# Patient Record
Sex: Female | Born: 1970 | Race: Black or African American | Hispanic: No | Marital: Married | Smoking: Never smoker
Health system: Southern US, Community
[De-identification: ages and names within clinical notes are randomized; demographics above are authoritative.]

## PROBLEM LIST (undated history)

## (undated) HISTORY — PX: TUBAL LIGATION: SHX77

---

## 2000-06-11 ENCOUNTER — Ambulatory Visit (HOSPITAL_COMMUNITY): Admission: RE | Admit: 2000-06-11 | Discharge: 2000-06-11 | Payer: Self-pay | Admitting: Neurosurgery

## 2000-10-09 ENCOUNTER — Ambulatory Visit (HOSPITAL_COMMUNITY): Admission: RE | Admit: 2000-10-09 | Discharge: 2000-10-09 | Payer: Self-pay | Admitting: Neurosurgery

## 2001-01-19 ENCOUNTER — Encounter: Payer: Self-pay | Admitting: Family Medicine

## 2001-01-19 ENCOUNTER — Ambulatory Visit (HOSPITAL_COMMUNITY): Admission: RE | Admit: 2001-01-19 | Discharge: 2001-01-19 | Payer: Self-pay | Admitting: Family Medicine

## 2001-09-24 ENCOUNTER — Other Ambulatory Visit: Admission: RE | Admit: 2001-09-24 | Discharge: 2001-09-24 | Payer: Self-pay | Admitting: Family Medicine

## 2014-08-30 ENCOUNTER — Encounter (HOSPITAL_COMMUNITY): Payer: Self-pay | Admitting: *Deleted

## 2014-08-30 ENCOUNTER — Emergency Department (HOSPITAL_COMMUNITY)
Admission: EM | Admit: 2014-08-30 | Discharge: 2014-08-30 | Disposition: A | Discharge status: Home / Self Care | Payer: Self-pay | Attending: Emergency Medicine | Admitting: Emergency Medicine

## 2014-08-30 ENCOUNTER — Emergency Department (HOSPITAL_COMMUNITY): Payer: Medicare Other

## 2014-08-30 DIAGNOSIS — K529 Noninfective gastroenteritis and colitis, unspecified: Secondary | ICD-10-CM | POA: Insufficient documentation

## 2014-08-30 DIAGNOSIS — Z3202 Encounter for pregnancy test, result negative: Secondary | ICD-10-CM | POA: Insufficient documentation

## 2014-08-30 DIAGNOSIS — Z9851 Tubal ligation status: Secondary | ICD-10-CM | POA: Insufficient documentation

## 2014-08-30 DIAGNOSIS — R079 Chest pain, unspecified: Secondary | ICD-10-CM | POA: Insufficient documentation

## 2014-08-30 DIAGNOSIS — Z79899 Other long term (current) drug therapy: Secondary | ICD-10-CM | POA: Insufficient documentation

## 2014-08-30 LAB — CBC WITH DIFFERENTIAL/PLATELET
Basophils Absolute: 0 10*3/uL (ref 0.0–0.1)
Basophils Relative: 0 % (ref 0–1)
Eosinophils Absolute: 0 10*3/uL (ref 0.0–0.7)
Eosinophils Relative: 0 % (ref 0–5)
HCT: 30.8 % — ABNORMAL LOW (ref 36.0–46.0)
Hemoglobin: 9.6 g/dL — ABNORMAL LOW (ref 12.0–15.0)
Lymphocytes Relative: 6 % — ABNORMAL LOW (ref 12–46)
Lymphs Abs: 0.5 10*3/uL — ABNORMAL LOW (ref 0.7–4.0)
MCH: 20.3 pg — ABNORMAL LOW (ref 26.0–34.0)
MCHC: 31.2 g/dL (ref 30.0–36.0)
MCV: 65.1 fL — ABNORMAL LOW (ref 78.0–100.0)
Monocytes Absolute: 0.5 10*3/uL (ref 0.1–1.0)
Monocytes Relative: 6 % (ref 3–12)
Neutro Abs: 7.7 10*3/uL (ref 1.7–7.7)
Neutrophils Relative %: 88 % — ABNORMAL HIGH (ref 43–77)
Platelets: 410 10*3/uL — ABNORMAL HIGH (ref 150–400)
RBC: 4.73 MIL/uL (ref 3.87–5.11)
RDW: 16.9 % — ABNORMAL HIGH (ref 11.5–15.5)
WBC: 8.7 10*3/uL (ref 4.0–10.5)

## 2014-08-30 LAB — PREGNANCY, URINE: Preg Test, Ur: NEGATIVE

## 2014-08-30 LAB — COMPREHENSIVE METABOLIC PANEL
ALT: 18 U/L (ref 0–35)
AST: 25 U/L (ref 0–37)
Albumin: 4.2 g/dL (ref 3.5–5.2)
Alkaline Phosphatase: 44 U/L (ref 39–117)
Anion gap: 6 (ref 5–15)
BUN: 13 mg/dL (ref 6–23)
CO2: 22 mmol/L (ref 19–32)
Calcium: 8.8 mg/dL (ref 8.4–10.5)
Chloride: 106 mmol/L (ref 96–112)
Creatinine, Ser: 0.75 mg/dL (ref 0.50–1.10)
GFR calc Af Amer: >90 mL/min (ref >90)
GFR calc non Af Amer: >90 mL/min (ref >90)
Glucose, Bld: 104 mg/dL — ABNORMAL HIGH (ref 70–99)
Potassium: 2.9 mmol/L — ABNORMAL LOW (ref 3.5–5.1)
Sodium: 134 mmol/L — ABNORMAL LOW (ref 135–145)
Total Bilirubin: 0.8 mg/dL (ref 0.3–1.2)
Total Protein: 7.5 g/dL (ref 6.0–8.3)

## 2014-08-30 LAB — LIPASE, BLOOD: Lipase: 29 U/L (ref 11–59)

## 2014-08-30 LAB — URINALYSIS, ROUTINE W REFLEX MICROSCOPIC
BILIRUBIN URINE: NEGATIVE
Glucose, UA: NEGATIVE mg/dL
Leukocytes, UA: NEGATIVE
Nitrite: NEGATIVE
PH: 7 (ref 5.0–8.0)
Protein, ur: NEGATIVE mg/dL
SPECIFIC GRAVITY, URINE: 1.02 (ref 1.005–1.030)
UROBILINOGEN UA: 0.2 mg/dL (ref 0.0–1.0)

## 2014-08-30 LAB — URINE MICROSCOPIC-ADD ON

## 2014-08-30 MED ORDER — ONDANSETRON HCL 4 MG/2ML IJ SOLN
4.0000 mg | Freq: Once | INTRAMUSCULAR | Status: AC
  Administered 2014-08-30: 4 mg via INTRAMUSCULAR
  Filled 2014-08-30: qty 2

## 2014-08-30 MED ORDER — PROMETHAZINE HCL 25 MG PO TABS: 25.0000 mg | ORAL_TABLET | Freq: Four times a day (QID) | ORAL | Status: DC | PRN

## 2014-08-30 MED ORDER — PROMETHAZINE HCL 25 MG/ML IJ SOLN
12.5000 mg | Freq: Once | INTRAMUSCULAR | Status: AC
Start: 2014-08-30 — End: 2014-08-30
  Administered 2014-08-30: 12.5 mg via INTRAVENOUS
  Filled 2014-08-30: qty 1

## 2014-08-30 MED ORDER — ONDANSETRON 4 MG PO TBDP: 4.0000 mg | ORAL_TABLET | Freq: Three times a day (TID) | ORAL | Status: DC | PRN

## 2014-08-30 MED ORDER — HYDROMORPHONE HCL 1 MG/ML IJ SOLN
1.0000 mg | Freq: Once | INTRAMUSCULAR | Status: AC
  Administered 2014-08-30: 1 mg via INTRAVENOUS
  Filled 2014-08-30: qty 1

## 2014-08-30 MED ORDER — SODIUM CHLORIDE 0.9 % IV SOLN
Freq: Once | INTRAVENOUS | Status: AC
  Administered 2014-08-30: 15:00:00 via INTRAVENOUS

## 2014-08-30 NOTE — ED Notes (Signed)
N/V/D, onset today. Lower back,, throat hurts.

## 2014-08-30 NOTE — ED Notes (Signed)
Pt on bed on all fours. Refused to let nurse assist her with getting into gown. Stated she was to weak to turn over and her husband could help her. Pt also refused to have temp checked

## 2014-08-30 NOTE — ED Notes (Signed)
Pt c/o cough/cold sx since yesterday with n/v/d and chills today. nad noted.

## 2014-08-30 NOTE — Discharge Instructions (Signed)
Rest and take antinausea medicine as directed. Also recommend the liquids with sugar and then bland diet as tolerated. Return for any new or worse symptoms. Bed rest would be recommended. Follow-up with your doctor if not improving in 1-2 days.

## 2014-08-30 NOTE — ED Provider Notes (Signed)
CSN: [id number]     Arrival date & time 08/30/14  1342 History  This chart was scribed for Stephanie Kidd Annarose Ouellet, MD by Bronson CurbJacqueline Melvin, ED Scribe. This patient was seen in room APA16A/APA16A and the patient's care was started at 2:42 PM.   Chief Complaint  Patient presents with  . Emesis     Patient is a 44 y.o. female presenting with vomiting. The history is provided by the patient. No language interpreter was used.  Emesis Severity:  Moderate Timing:  Intermittent Quality:  Stomach contents Progression:  Unchanged Chronicity:  New Relieved by:  None tried Worsened by:  Nothing tried Ineffective treatments:  None tried Associated symptoms: chills, diarrhea and headaches   Associated symptoms: no abdominal pain and no sore throat      HPI Comments: Stephanie Kidd is a 44 y.o. female, with no significant medical history, who presents to the Emergency Department complaining of sudden onset, intermittent vomiting that began PTA after taking half a dose of codeine cough syrup. Patient states she is currently vomiting stomach acid with streaks of blood. There is associated chills, lower back pain, headache, and chest pain described as tightness. She also reports 3 episodes of diarrhea, but states this has resolved. Patient is also complaining of cold symptoms of and sinus pressure that began last night at approximately 2100. She has tried Sudafed 2 days ago, but is currently taking Robitussin for the cough (nonproductive). SShe denies hematemesis, hematochezia, fever, rhinorrhea, sore throat, blurred vision, SOB, edema, abdominal pain, dysuria, neck pain, or rash.    History reviewed. No pertinent past medical history. Past Surgical History  Procedure Laterality Date  . Tubal ligation     History reviewed. No pertinent family history. History  Substance Use Topics  . Smoking status: Never Smoker   . Smokeless tobacco: Not on file  . Alcohol Use: No   OB History    No data available      Review of Systems  Constitutional: Positive for chills. Negative for fever.  HENT: Positive for congestion. Negative for rhinorrhea and sore throat.   Eyes: Negative for visual disturbance.  Respiratory: Positive for cough. Negative for shortness of breath.   Cardiovascular: Positive for chest pain. Negative for leg swelling.  Gastrointestinal: Positive for nausea, vomiting and diarrhea. Negative for abdominal pain and blood in stool.  Genitourinary: Negative for dysuria.  Musculoskeletal: Positive for back pain. Negative for neck pain.  Skin: Negative for rash.  Neurological: Positive for headaches. Negative for dizziness and light-headedness.  Hematological: Does not bruise/bleed easily.  Psychiatric/Behavioral: Negative for confusion.      Allergies  Review of patient's allergies indicates no active allergies.  Home Medications   Prior to Admission medications   Medication Sig Start Date End Date Taking? Authorizing Provider  acetaminophen-codeine 120-12 MG/5ML solution Take 5 mLs by mouth every 4 (four) hours as needed for moderate pain.   Yes Historical Provider, MD  Multiple Vitamins-Minerals (ADULT ONE DAILY GUMMIES PO) Take by mouth daily.   Yes Historical Provider, MD  pseudoephedrine (SUDAFED) 120 MG 12 hr tablet Take 120 mg by mouth every 12 (twelve) hours as needed for congestion.   Yes Historical Provider, MD  ondansetron (ZOFRAN ODT) 4 MG disintegrating tablet Take 1 tablet (4 mg total) by mouth every 8 (eight) hours as needed. 08/30/14   Stephanie Kidd Mitsue Peery, MD  promethazine (PHENERGAN) 25 MG tablet Take 1 tablet (25 mg total) by mouth every 6 (six) hours as needed. 08/30/14   Stephanie Kidd Bayan Hedstrom, MD  Triage Vitals: BP 134/107 mmHg  Pulse 80  Temp(Src) 99.2 F (37.3 C) (Oral)  Resp 24  SpO2 100%  LMP 08/29/2014  Physical Exam  Constitutional: She is oriented to person, place, and time. She appears well-developed and well-nourished. No distress.  HENT:  Head:  Normocephalic and atraumatic.  Mouth/Throat: Mucous membranes are normal.  Eyes: Conjunctivae and EOM are normal. Pupils are equal, round, and reactive to light.  Neck: Neck supple. No tracheal deviation present.  Cardiovascular: Normal rate, regular rhythm and normal heart sounds.   Pulmonary/Chest: Effort normal and breath sounds normal. No respiratory distress.  Lungs clear bilaterally.  Abdominal: Soft. Bowel sounds are normal. There is no tenderness.  Musculoskeletal: Normal range of motion. She exhibits no edema.  No swelling in the ankles.  Neurological: She is alert and oriented to person, place, and time.  Skin: Skin is warm and dry.  Psychiatric: She has a normal mood and affect. Her behavior is normal.  Nursing note and vitals reviewed.   ED Course  Procedures (including critical care time)  DIAGNOSTIC STUDIES: Oxygen Saturation is 100% on room air, normal by my interpretation.    COORDINATION OF CARE: At 1449 Discussed treatment plan with patient. Patient agrees.   Labs Review Labs Reviewed  COMPREHENSIVE METABOLIC PANEL - Abnormal; Notable for the following:    Sodium 134 (*)    Potassium 2.9 (*)    Glucose, Bld 104 (*)    All other components within normal limits  CBC WITH DIFFERENTIAL/PLATELET - Abnormal; Notable for the following:    Hemoglobin 9.6 (*)    HCT 30.8 (*)    MCV 65.1 (*)    MCH 20.3 (*)    RDW 16.9 (*)    Platelets 410 (*)    Neutrophils Relative % 88 (*)    Lymphocytes Relative 6 (*)    Lymphs Abs 0.5 (*)    All other components within normal limits  URINALYSIS, ROUTINE W REFLEX MICROSCOPIC - Abnormal; Notable for the following:    Hgb urine dipstick LARGE (*)    Ketones, ur TRACE (*)    All other components within normal limits  URINE MICROSCOPIC-ADD ON - Abnormal; Notable for the following:    Squamous Epithelial / LPF FEW (*)    All other components within normal limits  LIPASE, BLOOD  PREGNANCY, URINE   Results for orders placed  or performed during the hospital encounter of 08/30/14  Comprehensive metabolic panel  Result Value Ref Range   Sodium 134 (L) 135 - 145 mmol/L   Potassium 2.9 (L) 3.5 - 5.1 mmol/L   Chloride 106 96 - 112 mmol/L   CO2 22 19 - 32 mmol/L   Glucose, Bld 104 (H) 70 - 99 mg/dL   BUN 13 6 - 23 mg/dL   Creatinine, Ser 1.61 0.50 - 1.10 mg/dL   Calcium 8.8 8.4 - 09.6 mg/dL   Total Protein 7.5 6.0 - 8.3 g/dL   Albumin 4.2 3.5 - 5.2 g/dL   AST 25 0 - 37 U/L   ALT 18 0 - 35 U/L   Alkaline Phosphatase 44 39 - 117 U/L   Total Bilirubin 0.8 0.3 - 1.2 mg/dL   GFR calc non Af Amer >90 >90 mL/min   GFR calc Af Amer >90 >90 mL/min   Anion gap 6 5 - 15  Lipase, blood  Result Value Ref Range   Lipase 29 11 - 59 U/L  CBC with Differential  Result Value Ref Range  WBC 8.7 4.0 - 10.5 K/uL   RBC 4.73 3.87 - 5.11 MIL/uL   Hemoglobin 9.6 (L) 12.0 - 15.0 g/dL   HCT 16.1 (L) 09.6 - 04.5 %   MCV 65.1 (L) 78.0 - 100.0 fL   MCH 20.3 (L) 26.0 - 34.0 pg   MCHC 31.2 30.0 - 36.0 g/dL   RDW 40.9 (H) 81.1 - 91.4 %   Platelets 410 (H) 150 - 400 K/uL   Neutrophils Relative % 88 (H) 43 - 77 %   Lymphocytes Relative 6 (L) 12 - 46 %   Monocytes Relative 6 3 - 12 %   Eosinophils Relative 0 0 - 5 %   Basophils Relative 0 0 - 1 %   Neutro Abs 7.7 1.7 - 7.7 K/uL   Lymphs Abs 0.5 (L) 0.7 - 4.0 K/uL   Monocytes Absolute 0.5 0.1 - 1.0 K/uL   Eosinophils Absolute 0.0 0.0 - 0.7 K/uL   Basophils Absolute 0.0 0.0 - 0.1 K/uL   RBC Morphology POLYCHROMASIA PRESENT    Smear Review PLATELET COUNT CONFIRMED BY SMEAR   Pregnancy, urine  Result Value Ref Range   Preg Test, Ur NEGATIVE NEGATIVE  Urinalysis, Routine w reflex microscopic  Result Value Ref Range   Color, Urine YELLOW YELLOW   APPearance CLEAR CLEAR   Specific Gravity, Urine 1.020 1.005 - 1.030   pH 7.0 5.0 - 8.0   Glucose, UA NEGATIVE NEGATIVE mg/dL   Hgb urine dipstick LARGE (A) NEGATIVE   Bilirubin Urine NEGATIVE NEGATIVE   Ketones, ur TRACE (A)  NEGATIVE mg/dL   Protein, ur NEGATIVE NEGATIVE mg/dL   Urobilinogen, UA 0.2 0.0 - 1.0 mg/dL   Nitrite NEGATIVE NEGATIVE   Leukocytes, UA NEGATIVE NEGATIVE  Urine microscopic-add on  Result Value Ref Range   Squamous Epithelial / LPF FEW (A) RARE   WBC, UA 0-2 <3 WBC/hpf   RBC / HPF 11-20 <3 RBC/hpf   Bacteria, UA RARE RARE     Imaging Review Dg Abd Acute W/chest  08/30/2014   CLINICAL DATA:  Vomiting. Low back pain. Headache. Chest pain. Cold symptoms.  EXAM: ACUTE ABDOMEN SERIES (ABDOMEN 2 VIEW & CHEST 1 VIEW)  COMPARISON:  None.  FINDINGS: Frontal view of the chest demonstrates midline trachea. Normal heart size and mediastinal contours. No pleural effusion or pneumothorax. Clear lungs.  Abdominal films demonstrate no free intraperitoneal air or significant air-fluid levels on upright positioning. No bowel distention on supine imaging. Mild S-shaped thoracolumbar spine curvature. 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  IMPRESSION: No acute findings.  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   Electronically Signed   By: Jeronimo Greaves M.D.   On: 08/30/2014 15:57     EKG Interpretation None      MDM   Final diagnoses:  Chest pain  Gastroenteritis    The patient last evening started with upper respiratory type symptoms of cough some drainage chills and then today started with nausea vomiting and diarrhea. Some chest discomfort throughout all of the anterior part of the chest but nothing severe. Patient improved here with fluids and antinausea medicine and pain medicine. Patient was taking cough medicine with codeine that could've caused the nausea and vomiting but should not cause the diarrhea.  Clinically not concerned about an acute cardiac event. The discomfort in the chest seems to be associated with the vomiting.  I personally performed the services described  in  this documentation, which was scribed in my presence. The recorded information has been reviewed and is accurate.     Stephanie Mulders, MD 08/30/14 [phone]

## 2017-01-16 ENCOUNTER — Ambulatory Visit (INDEPENDENT_AMBULATORY_CARE_PROVIDER_SITE_OTHER): Payer: Self-pay | Admitting: Orthopaedic Surgery

## 2017-12-23 ENCOUNTER — Other Ambulatory Visit (HOSPITAL_COMMUNITY): Payer: Self-pay | Admitting: *Deleted

## 2017-12-23 DIAGNOSIS — Z1231 Encounter for screening mammogram for malignant neoplasm of breast: Secondary | ICD-10-CM

## 2018-01-12 ENCOUNTER — Encounter (HOSPITAL_COMMUNITY): Payer: Self-pay

## 2018-01-12 ENCOUNTER — Ambulatory Visit (HOSPITAL_COMMUNITY)
Admission: RE | Admit: 2018-01-12 | Discharge: 2018-01-12 | Disposition: A | Discharge status: Home / Self Care | Payer: PRIVATE HEALTH INSURANCE | Source: Ambulatory Visit | Attending: *Deleted | Admitting: *Deleted

## 2018-01-12 DIAGNOSIS — Z1231 Encounter for screening mammogram for malignant neoplasm of breast: Secondary | ICD-10-CM | POA: Insufficient documentation

## 2018-03-19 ENCOUNTER — Ambulatory Visit (INDEPENDENT_AMBULATORY_CARE_PROVIDER_SITE_OTHER): Payer: Self-pay | Admitting: Orthopaedic Surgery

## 2018-12-08 ENCOUNTER — Other Ambulatory Visit (HOSPITAL_COMMUNITY): Payer: Self-pay | Admitting: *Deleted

## 2018-12-08 DIAGNOSIS — Z1231 Encounter for screening mammogram for malignant neoplasm of breast: Secondary | ICD-10-CM

## 2019-01-15 ENCOUNTER — Ambulatory Visit (HOSPITAL_COMMUNITY)
Admission: RE | Admit: 2019-01-15 | Discharge: 2019-01-15 | Disposition: A | Discharge status: Home / Self Care | Payer: PRIVATE HEALTH INSURANCE | Source: Ambulatory Visit | Attending: *Deleted | Admitting: *Deleted

## 2019-01-15 ENCOUNTER — Other Ambulatory Visit: Payer: Self-pay

## 2019-01-15 DIAGNOSIS — Z1231 Encounter for screening mammogram for malignant neoplasm of breast: Secondary | ICD-10-CM | POA: Diagnosis not present

## 2020-04-18 ENCOUNTER — Other Ambulatory Visit (HOSPITAL_COMMUNITY): Payer: Self-pay | Admitting: *Deleted

## 2020-04-18 DIAGNOSIS — Z1231 Encounter for screening mammogram for malignant neoplasm of breast: Secondary | ICD-10-CM

## 2020-05-10 ENCOUNTER — Other Ambulatory Visit: Payer: Self-pay

## 2020-05-10 ENCOUNTER — Ambulatory Visit (HOSPITAL_COMMUNITY)
Admission: RE | Admit: 2020-05-10 | Discharge: 2020-05-10 | Disposition: A | Discharge status: Home / Self Care | Payer: PRIVATE HEALTH INSURANCE | Source: Ambulatory Visit | Attending: *Deleted | Admitting: *Deleted

## 2020-05-10 DIAGNOSIS — Z1231 Encounter for screening mammogram for malignant neoplasm of breast: Secondary | ICD-10-CM | POA: Insufficient documentation

## 2020-05-12 ENCOUNTER — Other Ambulatory Visit (HOSPITAL_COMMUNITY): Payer: Self-pay | Admitting: *Deleted

## 2020-05-12 DIAGNOSIS — R928 Other abnormal and inconclusive findings on diagnostic imaging of breast: Secondary | ICD-10-CM

## 2020-06-06 ENCOUNTER — Ambulatory Visit (HOSPITAL_COMMUNITY)
Admission: RE | Admit: 2020-06-06 | Discharge: 2020-06-06 | Disposition: A | Discharge status: Home / Self Care | Payer: PRIVATE HEALTH INSURANCE | Source: Ambulatory Visit | Attending: *Deleted | Admitting: *Deleted

## 2020-06-06 ENCOUNTER — Other Ambulatory Visit: Payer: Self-pay

## 2020-06-06 DIAGNOSIS — R928 Other abnormal and inconclusive findings on diagnostic imaging of breast: Secondary | ICD-10-CM | POA: Diagnosis present

## 2020-06-13 ENCOUNTER — Encounter (HOSPITAL_COMMUNITY): Payer: PRIVATE HEALTH INSURANCE

## 2020-06-13 ENCOUNTER — Other Ambulatory Visit (HOSPITAL_COMMUNITY): Payer: PRIVATE HEALTH INSURANCE

## 2021-05-14 ENCOUNTER — Other Ambulatory Visit: Payer: Self-pay

## 2021-05-14 DIAGNOSIS — Z1231 Encounter for screening mammogram for malignant neoplasm of breast: Secondary | ICD-10-CM

## 2021-06-21 ENCOUNTER — Other Ambulatory Visit (HOSPITAL_COMMUNITY): Payer: Self-pay | Admitting: Radiology

## 2021-06-22 ENCOUNTER — Ambulatory Visit (HOSPITAL_COMMUNITY): Payer: Self-pay

## 2021-07-13 ENCOUNTER — Inpatient Hospital Stay (HOSPITAL_COMMUNITY): Admission: RE | Admit: 2021-07-13 | Payer: Self-pay | Source: Ambulatory Visit

## 2021-07-13 ENCOUNTER — Ambulatory Visit (HOSPITAL_COMMUNITY): Payer: Self-pay

## 2021-07-27 ENCOUNTER — Inpatient Hospital Stay (HOSPITAL_COMMUNITY): Payer: Self-pay | Attending: Hematology | Admitting: *Deleted

## 2021-07-27 ENCOUNTER — Ambulatory Visit (HOSPITAL_COMMUNITY)
Admission: RE | Admit: 2021-07-27 | Discharge: 2021-07-27 | Disposition: A | Discharge status: Home / Self Care | Payer: Self-pay | Source: Ambulatory Visit | Attending: Obstetrics and Gynecology | Admitting: Obstetrics and Gynecology

## 2021-07-27 ENCOUNTER — Encounter (HOSPITAL_COMMUNITY): Payer: Self-pay

## 2021-07-27 ENCOUNTER — Other Ambulatory Visit: Payer: Self-pay

## 2021-07-27 VITALS — BP 116/85 | Wt 181.9 lb

## 2021-07-27 DIAGNOSIS — Z01419 Encounter for gynecological examination (general) (routine) without abnormal findings: Secondary | ICD-10-CM

## 2021-07-27 DIAGNOSIS — Z1231 Encounter for screening mammogram for malignant neoplasm of breast: Secondary | ICD-10-CM | POA: Insufficient documentation

## 2021-07-27 NOTE — Progress Notes (Signed)
[name] is a 51 y.o. No obstetric history on file. female who presents to [facility] clinic today with no complaints.    Pap Smear: Pap smear completed today. Last Pap smear was over 3 years ago at the [facility] clinic and was normal per patient. Per patient has no history of an abnormal Pap smear. Last Pap smear result is not available in Epic.   Physical exam: Breasts Breasts symmetrical. No skin abnormalities bilateral breasts. No nipple retraction bilateral breasts. No nipple discharge bilateral breasts. No lymphadenopathy. No lumps palpated bilateral breasts. No complaints of pain or tenderness on exam.     MS DIGITAL SCREENING TOMO BILATERAL  Result Date: 05/12/2020 CLINICAL DATA:  Screening. EXAM: DIGITAL SCREENING BILATERAL MAMMOGRAM WITH TOMO AND CAD COMPARISON:  Previous exam(s). ACR Breast Density Category b: There are scattered areas of fibroglandular density. FINDINGS: In the right breast, a possible asymmetry warrants further evaluation. In the left breast, no findings suspicious for malignancy. Images were processed with CAD. IMPRESSION: Further evaluation is suggested for possible asymmetry in the right breast. RECOMMENDATION: Diagnostic mammogram and possibly ultrasound of the right breast. (Code:FI-R-76M) The patient will be contacted regarding the findings, and additional imaging will be scheduled. BI-RADS CATEGORY  0: Incomplete. Need additional imaging evaluation and/or prior mammograms for comparison. Electronically Signed   By: Ted Mcalpine M.D.   On: 05/12/2020 09:37   MS DIGITAL SCREENING TOMO BILATERAL  Result Date: 01/15/2019 CLINICAL DATA:  Screening. EXAM: DIGITAL SCREENING BILATERAL MAMMOGRAM WITH TOMO AND CAD COMPARISON:  Previous exam(s). ACR Breast Density Category c: The breast tissue is heterogeneously dense, which may obscure small masses. FINDINGS: There are no findings suspicious for malignancy. Images were processed with CAD.  IMPRESSION: No mammographic evidence of malignancy. A result letter of this screening mammogram will be mailed directly to the patient. RECOMMENDATION: Screening mammogram in one year. (Code:SM-B-01Y) BI-RADS CATEGORY  1: Negative. Electronically Signed   By: Frederico Hamman M.D.   On: 01/15/2019 10:08   MS DIGITAL SCREENING TOMO BILATERAL  Result Date: 01/12/2018 CLINICAL DATA:  Screening. EXAM: DIGITAL SCREENING BILATERAL MAMMOGRAM WITH TOMO AND CAD COMPARISON:  Previous exam(s). ACR Breast Density Category c: The breast tissue is heterogeneously dense, which may obscure small masses. FINDINGS: There are no findings suspicious for malignancy. Images were processed with CAD. IMPRESSION: No mammographic evidence of malignancy. A result letter of this screening mammogram will be mailed directly to the patient. RECOMMENDATION: Screening mammogram in one year. (Code:[id number]) BI-RADS CATEGORY  1: Negative. Electronically Signed   By: Ted Mcalpine M.D.   On: 01/12/2018 17:24   MS DIGITAL DIAG TOMO UNI RIGHT  Result Date: 06/06/2020 CLINICAL DATA:  Possible asymmetry in the outer right breast on a recent screening mammogram. EXAM: DIGITAL DIAGNOSTIC UNILATERAL RIGHT MAMMOGRAM WITH TOMO AND CAD COMPARISON:  Previous exam(s). ACR Breast Density Category b: There are scattered areas of fibroglandular density. FINDINGS: 3D tomographic and 2D generated true lateral and spot compression craniocaudal images of the right breast demonstrate normal appearing fibroglandular tissue at the location of the recently suspected asymmetry, unchanged compared previous examinations. Mammographic images were processed with CAD. IMPRESSION: No evidence of malignancy. The recently suspected right breast asymmetry was close apposition of normal breast tissue. RECOMMENDATION: Bilateral screening mammogram in 1 year. I have discussed the findings and recommendations with the patient. If applicable, a reminder letter will be sent  to the patient regarding the next appointment. BI-RADS CATEGORY  1: Negative. Electronically Signed   By:  Beckie Salts M.D.   On: 06/06/2020 08:41     Pelvic/Bimanual Ext Genitalia No lesions, no swelling and no discharge observed on external genitalia.        Vagina Vagina pink and normal texture. No lesions and scant amount of white frothy discharge observed in vagina. Patient complained of vaginal discharge. Told patient if the Pap smear shows an infection will send a prescription to her pharmacy. Advised patient to follow-up with the [facility] if the vaginal discharge continues.        Cervix Cervix is present. Cervix pink and of normal texture. No discharge observed.    Uterus Uterus is present and palpable. Uterus in normal position and normal size.        Adnexae Bilateral ovaries present and palpable. No tenderness on palpation.         Rectovaginal No rectal exam completed today since patient had no rectal complaints. No skin abnormalities observed on exam.     Smoking History: Patient has never smoked.   Patient Navigation: Patient education provided. Access to services provided for patient through [facility] program.   Colorectal Cancer Screening: Per patient has never had colonoscopy completed. Patient stated she completed a FIT in October or November 2022 that was negative that was given to her by the [facility]. No complaints today.    Breast and Cervical Cancer Risk Assessment: Patient has family history of her mother having breast cancer. Patient has no known genetic mutations or history of radiation treatment to the chest before age 35. Patient does not have history of cervical dysplasia, immunocompromised, or DES exposure in-utero.  Risk Assessment     Risk Scores       07/27/2021   Last edited by: Narda Rutherford, LPN   5-year risk: 1.9 %   Lifetime risk: 13.3 %            A: [facility] exam with pap  smear No complaints.  P: Referred patient to [facility] for a screening mammogram. Appointment scheduled Friday, July 27, 2021 at 1300.  Priscille Heidelberg, RN 07/27/2021 12:06 PM

## 2021-07-27 NOTE — Patient Instructions (Addendum)
Explained breast self awareness with [name]. Pap smear completed today. Let her know [facility] will cover Pap smears and HPV typing every 5 years unless has a history of abnormal Pap smears. Referred patient to [facility] for a screening mammogram. Appointment scheduled Friday, July 27, 2021 at 1300. Patient escorted to mammography following [facility] appointment for her screening mammogram. Let patient know will follow up with her within the next couple weeks with results of Pap smear by phone. Informed patient that [name] will follow up with her within the next couple of weeks with results of her mammogram by letter or phone. Stephanie Kidd verbalized understanding.  Viren Lebeau, Kathaleen Maser, RN 12:06 PM

## 2021-07-30 ENCOUNTER — Other Ambulatory Visit: Payer: Self-pay

## 2021-07-30 DIAGNOSIS — Z01419 Encounter for gynecological examination (general) (routine) without abnormal findings: Secondary | ICD-10-CM

## 2021-07-31 LAB — CYTOLOGY - PAP
Comment: NEGATIVE
Diagnosis: NEGATIVE
High risk HPV: NEGATIVE

## 2021-08-01 ENCOUNTER — Telehealth: Payer: Self-pay

## 2021-08-01 NOTE — Telephone Encounter (Signed)
Patient informed negative Pap/HPV results, no yeast infection or bacterial vaginitis was seen on pap smear, next pap due in 5 years. Patient verbalized understanding.

## 2022-12-26 ENCOUNTER — Other Ambulatory Visit: Payer: Self-pay | Admitting: *Deleted

## 2022-12-26 DIAGNOSIS — Z1231 Encounter for screening mammogram for malignant neoplasm of breast: Secondary | ICD-10-CM

## 2023-01-06 ENCOUNTER — Ambulatory Visit (HOSPITAL_COMMUNITY)
Admission: RE | Admit: 2023-01-06 | Discharge: 2023-01-06 | Disposition: A | Discharge status: Home / Self Care | Payer: Self-pay | Source: Ambulatory Visit | Attending: *Deleted | Admitting: *Deleted

## 2023-01-06 ENCOUNTER — Encounter (HOSPITAL_COMMUNITY): Payer: Self-pay

## 2023-01-06 DIAGNOSIS — Z1231 Encounter for screening mammogram for malignant neoplasm of breast: Secondary | ICD-10-CM | POA: Insufficient documentation

## 2023-01-21 ENCOUNTER — Encounter (INDEPENDENT_AMBULATORY_CARE_PROVIDER_SITE_OTHER): Payer: Self-pay | Admitting: Gastroenterology

## 2023-01-21 ENCOUNTER — Ambulatory Visit (INDEPENDENT_AMBULATORY_CARE_PROVIDER_SITE_OTHER): Payer: Self-pay | Admitting: Gastroenterology

## 2023-01-21 VITALS — BP 138/85 | HR 71 | Temp 97.3°F | Ht <= 58 in | Wt 188.2 lb

## 2023-01-21 DIAGNOSIS — D500 Iron deficiency anemia secondary to blood loss (chronic): Secondary | ICD-10-CM

## 2023-01-21 DIAGNOSIS — Z1211 Encounter for screening for malignant neoplasm of colon: Secondary | ICD-10-CM | POA: Insufficient documentation

## 2023-01-21 DIAGNOSIS — D509 Iron deficiency anemia, unspecified: Secondary | ICD-10-CM

## 2023-01-21 MED ORDER — PEG 3350-KCL-NA BICARB-NACL 420 G PO SOLR: 4000.0000 mL | Freq: Once | ORAL | 0 refills | Status: AC

## 2023-01-21 NOTE — Progress Notes (Signed)
Vista Lawman , M.D. Gastroenterology & Hepatology [facility] [address] [place], [place] [address] Primary Care Physician: Tylene Fantasia., PA-C [address] [place] [address]  Chief Complaint:  Microcytic anemia and Colon cancer screening    History of Present Illness: Stephanie Kidd is a 52 y.o. female with no significant medical issues except obesity ( BMI : 40) who presents for evaluation of  Microcytic anemia and Colon cancer screening .  Patient reports that at one time she was told that she had iron deficiency, and she was taking iron tablets.  During that time she had hair loss and this was managed by gynecologist.  She had now stopped taking iron tablets.  Patient is fairly healthy and has no GI complaints. The patient denies having any nausea, vomiting, fever, chills, hematochezia, melena, hematemesis, abdominal distention, abdominal pain, diarrhea, jaundice, pruritus or weight loss.  Last FAO:ZHYQ  Last Colonoscopy:None  FHx: neg for any gastrointestinal/liver disease, Mother breast cancer Social: neg smoking, alcohol or illicit drug use Surgical: -C-Section, TL  Past Medical History:History reviewed. No pertinent past medical history.  Past Surgical History: Past Surgical History:  Procedure Laterality Date   CESAREAN SECTION  1989   TUBAL LIGATION      Family History: Family History  Problem Relation Age of Onset   Breast cancer Mother 11   Hypertension Maternal Grandmother     Social History: Social History   Tobacco Use  Smoking Status Never  Smokeless Tobacco Not on file   Social History   Substance and Sexual Activity  Alcohol Use No   Social History   Substance and Sexual Activity  Drug Use No    Allergies: No Known Allergies  Medications: Current Outpatient Medications  Medication Sig Dispense Refill   acetaminophen-codeine 120-12 MG/5ML solution Take 5 mLs by mouth  every 4 (four) hours as needed for moderate pain.     cetirizine (ZYRTEC) 10 MG tablet Take 10 mg by mouth daily.     Multiple Vitamins-Minerals (ADULT ONE DAILY GUMMIES PO) Take by mouth daily.     pseudoephedrine (SUDAFED) 120 MG 12 hr tablet Take 120 mg by mouth every 12 (twelve) hours as needed for congestion.     No current facility-administered medications for this visit.    Review of Systems: GENERAL: negative for malaise, night sweats HEENT: No changes in hearing or vision, no nose bleeds or other nasal problems. NECK: Negative for lumps, goiter, pain and significant neck swelling RESPIRATORY: Negative for cough, wheezing CARDIOVASCULAR: Negative for chest pain, leg swelling, palpitations, orthopnea GI: SEE HPI MUSCULOSKELETAL: Negative for joint pain or swelling, back pain, and muscle pain. SKIN: Negative for lesions, rash HEMATOLOGY Negative for prolonged bleeding, bruising easily, and swollen nodes. ENDOCRINE: Negative for cold or heat intolerance, polyuria, polydipsia and goiter. NEURO: negative for tremor, gait imbalance, syncope and seizures. The remainder of the review of systems is noncontributory.   Physical Exam: BP 138/85 (BP Location: Right Arm, Patient Position: Sitting, Cuff Size: Large)   Pulse 71   Temp (!) 97.3 F (36.3 C) (Temporal)   Ht 4\' 9"  (1.448 m)   Wt 188 lb 3.2 oz (85.4 kg)   LMP 11/12/2017   BMI 40.73 kg/m  GENERAL: The patient is AO x3, in no acute distress. HEENT: Head is normocephalic and atraumatic. EOMI are intact. Mouth is well hydrated and without lesions. NECK: Supple. No masses LUNGS: Clear to auscultation. No presence of rhonchi/wheezing/rales. Adequate  chest expansion HEART: RRR, normal s1 and s2. ABDOMEN: Soft, nontender, no guarding, no peritoneal signs, and nondistended. BS +. No masses.  EXTREMITIES: Without any cyanosis, clubbing, rash, lesions or edema. NEUROLOGIC: AOx3, no focal motor deficit. SKIN: no jaundice, no  rashes   Imaging/Labs: as above  I personally reviewed and interpreted the available labs, imaging and endoscopic files.  MCV: 70 HBG : 10.7   Impression and Plan:  Stephanie Kidd is a 52 y.o. female with no significant medical issues except obesity ( BMI : 40) who presents for evaluation of  Microcytic anemia and Colon cancer screening .  #Microcytic anemia  Microcytic anemia can be due to iron deficiency or thalassemia.  Patient was told at one time she has iron deficiency and was on iron supplementation.  She has chronic microcytic anemia dating back to 2016 with current lab work demonstrating hemoglobin 10.7 MCV 70.  We do not have any recent iron panel.  Patient had stopped taking iron supplementation. We will get a repeat CBC with iron panel.  Given history of presumed iron deficiency we will pursue bidirectional endoscopy as per ACG guidelines  #CRC screening   Patient is average risk for colorectal cancer.  Does not have constipation.  Will pursue with colonoscopy  BMI: 40  Lab work with normal liver enzymes, no imaging on file       - walking at a brisk pace/biking at moderate intesity 2.5-5 hours per week     - use pedometer/step counter to track activity     - goal to lose 5-10% of initial body weight     - avoid suagry drinks and juices, use zero calorie beverages     - increase water intake     - eat a low carb diet with plenty of veggies and fruit     - Get sufficient sleep 7-8 hrs nightly     - maitain active lifestyle     - avoid alcohol     - recommend 2-3 cups Coffee daily     - Counsel on lowering cholesterol by having a diet rich in vegetables,          protein (avoid red meats) and good fats(fish, salmon).   All questions were answered.      Vista Lawman, MD Gastroenterology and Hepatology [facility]

## 2023-01-21 NOTE — H&P (View-Only) (Signed)
Vista Lawman , M.D. Gastroenterology & Hepatology [facility] [address] [place], [place] [address] Primary Care Physician: Tylene Fantasia., PA-C [address] [place] [address]  Chief Complaint:  Microcytic anemia and Colon cancer screening    History of Present Illness: Stephanie Kidd is a 52 y.o. female with no significant medical issues except obesity ( BMI : 40) who presents for evaluation of  Microcytic anemia and Colon cancer screening .  Patient reports that at one time she was told that she had iron deficiency, and she was taking iron tablets.  During that time she had hair loss and this was managed by gynecologist.  She had now stopped taking iron tablets.  Patient is fairly healthy and has no GI complaints. The patient denies having any nausea, vomiting, fever, chills, hematochezia, melena, hematemesis, abdominal distention, abdominal pain, diarrhea, jaundice, pruritus or weight loss.  Last FAO:ZHYQ  Last Colonoscopy:None  FHx: neg for any gastrointestinal/liver disease, Mother breast cancer Social: neg smoking, alcohol or illicit drug use Surgical: -C-Section, TL  Past Medical History:History reviewed. No pertinent past medical history.  Past Surgical History: Past Surgical History:  Procedure Laterality Date   CESAREAN SECTION  1989   TUBAL LIGATION      Family History: Family History  Problem Relation Age of Onset   Breast cancer Mother 11   Hypertension Maternal Grandmother     Social History: Social History   Tobacco Use  Smoking Status Never  Smokeless Tobacco Not on file   Social History   Substance and Sexual Activity  Alcohol Use No   Social History   Substance and Sexual Activity  Drug Use No    Allergies: No Known Allergies  Medications: Current Outpatient Medications  Medication Sig Dispense Refill   acetaminophen-codeine 120-12 MG/5ML solution Take 5 mLs by mouth  every 4 (four) hours as needed for moderate pain.     cetirizine (ZYRTEC) 10 MG tablet Take 10 mg by mouth daily.     Multiple Vitamins-Minerals (ADULT ONE DAILY GUMMIES PO) Take by mouth daily.     pseudoephedrine (SUDAFED) 120 MG 12 hr tablet Take 120 mg by mouth every 12 (twelve) hours as needed for congestion.     No current facility-administered medications for this visit.    Review of Systems: GENERAL: negative for malaise, night sweats HEENT: No changes in hearing or vision, no nose bleeds or other nasal problems. NECK: Negative for lumps, goiter, pain and significant neck swelling RESPIRATORY: Negative for cough, wheezing CARDIOVASCULAR: Negative for chest pain, leg swelling, palpitations, orthopnea GI: SEE HPI MUSCULOSKELETAL: Negative for joint pain or swelling, back pain, and muscle pain. SKIN: Negative for lesions, rash HEMATOLOGY Negative for prolonged bleeding, bruising easily, and swollen nodes. ENDOCRINE: Negative for cold or heat intolerance, polyuria, polydipsia and goiter. NEURO: negative for tremor, gait imbalance, syncope and seizures. The remainder of the review of systems is noncontributory.   Physical Exam: BP 138/85 (BP Location: Right Arm, Patient Position: Sitting, Cuff Size: Large)   Pulse 71   Temp (!) 97.3 F (36.3 C) (Temporal)   Ht 4\' 9"  (1.448 m)   Wt 188 lb 3.2 oz (85.4 kg)   LMP 11/12/2017   BMI 40.73 kg/m  GENERAL: The patient is AO x3, in no acute distress. HEENT: Head is normocephalic and atraumatic. EOMI are intact. Mouth is well hydrated and without lesions. NECK: Supple. No masses LUNGS: Clear to auscultation. No presence of rhonchi/wheezing/rales. Adequate  chest expansion HEART: RRR, normal s1 and s2. ABDOMEN: Soft, nontender, no guarding, no peritoneal signs, and nondistended. BS +. No masses.  EXTREMITIES: Without any cyanosis, clubbing, rash, lesions or edema. NEUROLOGIC: AOx3, no focal motor deficit. SKIN: no jaundice, no  rashes   Imaging/Labs: as above  I personally reviewed and interpreted the available labs, imaging and endoscopic files.  MCV: 70 HBG : 10.7   Impression and Plan:  Stephanie Kidd is a 52 y.o. female with no significant medical issues except obesity ( BMI : 40) who presents for evaluation of  Microcytic anemia and Colon cancer screening .  #Microcytic anemia  Microcytic anemia can be due to iron deficiency or thalassemia.  Patient was told at one time she has iron deficiency and was on iron supplementation.  She has chronic microcytic anemia dating back to 2016 with current lab work demonstrating hemoglobin 10.7 MCV 70.  We do not have any recent iron panel.  Patient had stopped taking iron supplementation. We will get a repeat CBC with iron panel.  Given history of presumed iron deficiency we will pursue bidirectional endoscopy as per ACG guidelines  #CRC screening   Patient is average risk for colorectal cancer.  Does not have constipation.  Will pursue with colonoscopy  BMI: 40  Lab work with normal liver enzymes, no imaging on file       - walking at a brisk pace/biking at moderate intesity 2.5-5 hours per week     - use pedometer/step counter to track activity     - goal to lose 5-10% of initial body weight     - avoid suagry drinks and juices, use zero calorie beverages     - increase water intake     - eat a low carb diet with plenty of veggies and fruit     - Get sufficient sleep 7-8 hrs nightly     - maitain active lifestyle     - avoid alcohol     - recommend 2-3 cups Coffee daily     - Counsel on lowering cholesterol by having a diet rich in vegetables,          protein (avoid red meats) and good fats(fish, salmon).   All questions were answered.      Vista Lawman, MD Gastroenterology and Hepatology [facility]

## 2023-01-21 NOTE — Patient Instructions (Signed)
It was very nice to meet you today, as dicussed with will plan for the following :  1) Will schedule for Upper endoscopy and Colonoscopy given anemia

## 2023-01-22 ENCOUNTER — Telehealth: Payer: Self-pay

## 2023-01-22 LAB — CBC
Hematocrit: 36.4 % (ref 34.0–46.6)
Hemoglobin: 11.3 g/dL (ref 11.1–15.9)
MCH: 22.4 pg — ABNORMAL LOW (ref 26.6–33.0)
MCHC: 31 g/dL — ABNORMAL LOW (ref 31.5–35.7)
MCV: 72 fL — ABNORMAL LOW (ref 79–97)
Platelets: 332 10*3/uL (ref 150–450)
RBC: 5.04 x10E6/uL (ref 3.77–5.28)
RDW: 16.5 % — ABNORMAL HIGH (ref 11.7–15.4)
WBC: 6.1 10*3/uL (ref 3.4–10.8)

## 2023-01-22 LAB — IRON,TIBC AND FERRITIN PANEL
Ferritin: 60 ng/mL (ref 15–150)
Iron Saturation: 24 % (ref 15–55)
Iron: 77 ug/dL (ref 27–159)
Total Iron Binding Capacity: 324 ug/dL (ref 250–450)
UIBC: 247 ug/dL (ref 131–425)

## 2023-01-22 NOTE — Telephone Encounter (Signed)
Left message requesting return call, regarding radiology bill. Left message on voicemail requesting a return call.

## 2023-01-23 ENCOUNTER — Encounter (INDEPENDENT_AMBULATORY_CARE_PROVIDER_SITE_OTHER): Payer: Self-pay

## 2023-01-27 NOTE — Patient Instructions (Signed)
Stephanie Kidd  01/27/2023     @PREFPERIOPPHARMACY @   Your procedure is scheduled on  01/30/2023.   Report to [name] at  1130  A.M.   Call this number if you have problems the morning of surgery:  [phone]  If you experience any cold or flu symptoms such as cough, fever, chills, shortness of breath, etc. between now and your scheduled surgery, please notify us at the above number.   Remember:  Follow the diet and prep instructions given to you by the office.     Take these medicines the morning of surgery with A SIP OF WATER                                             zyrtec.     Do not wear jewelry, make-up or nail polish, including gel polish,  artificial nails, or any other type of covering on natural nails (fingers and  toes).  Do not wear lotions, powders, or perfumes, or deodorant.  Do not shave 48 hours prior to surgery.  Men may shave face and neck.  Do not bring valuables to the hospital.  [facility] is not responsible for any belongings or valuables.  Contacts, dentures or bridgework may not be worn into surgery.  Leave your suitcase in the car.  After surgery it may be brought to your room.  For patients admitted to the hospital, discharge time will be determined by your treatment team.  Patients discharged the day of surgery will not be allowed to drive home and must have someone with you for 24 hours.    Special instructions:   DO NOT smoke tobacco or vape for 24 hours before your procedure.  Please read over the following fact sheets that you were given. Anesthesia Post-op Instructions and Care and Recovery After Surgery      Upper Endoscopy, Adult, Care After After the procedure, it is common to have a sore throat. It is also common to have: Mild stomach pain or discomfort. Bloating. Nausea. Follow these instructions at home: The instructions below may help you care for yourself at home. Your health care provider may give you more  instructions. If you have questions, ask your health care provider. If you were given a sedative during the procedure, it can affect you for several hours. Do not drive or operate machinery until your health care provider says that it is safe. If you will be going home right after the procedure, plan to have a responsible adult: Take you home from the hospital or clinic. You will not be allowed to drive. Care for you for the time you are told. Follow instructions from your health care provider about what you may eat and drink. Return to your normal activities as told by your health care provider. Ask your health care provider what activities are safe for you. Take over-the-counter and prescription medicines only as told by your health care provider. Contact a health care provider if you: Have a sore throat that lasts longer than one day. Have trouble swallowing. Have a fever. Get help right away if you: Vomit blood or your vomit looks like coffee grounds. Have bloody, black, or tarry stools. Have a very bad sore throat or you cannot swallow. Have difficulty breathing or very bad pain in your chest or abdomen. These  symptoms may be an emergency. Get help right away. Call 911. Do not wait to see if the symptoms will go away. Do not drive yourself to the hospital. Summary After the procedure, it is common to have a sore throat, mild stomach discomfort, bloating, and nausea. If you were given a sedative during the procedure, it can affect you for several hours. Do not drive until your health care provider says that it is safe. Follow instructions from your health care provider about what you may eat and drink. Return to your normal activities as told by your health care provider. This information is not intended to replace advice given to you by your health care provider. Make sure you discuss any questions you have with your health care provider. Document Revised: 10/03/2021 Document Reviewed:  10/03/2021 Elsevier Patient Education  2024 Elsevier [employer]. Colonoscopy, Adult, Care After The following information offers guidance on how to care for yourself after your procedure. Your health care provider may also give you more specific instructions. If you have problems or questions, contact your health care provider. What can I expect after the procedure? After the procedure, it is common to have: A small amount of blood in your stool for 24 hours after the procedure. Some gas. Mild cramping or bloating of your abdomen. Follow these instructions at home: Eating and drinking  Drink enough fluid to keep your urine pale yellow. Follow instructions from your health care provider about eating or drinking restrictions. Resume your normal diet as told by your health care provider. Avoid heavy or fried foods that are hard to digest. Activity Rest as told by your health care provider. Avoid sitting for a long time without moving. Get up to take short walks every 1-2 hours. This is important to improve blood flow and breathing. Ask for help if you feel weak or unsteady. Return to your normal activities as told by your health care provider. Ask your health care provider what activities are safe for you. Managing cramping and bloating  Try walking around when you have cramps or feel bloated. If directed, apply heat to your abdomen as told by your health care provider. Use the heat source that your health care provider recommends, such as a moist heat pack or a heating pad. Place a towel between your skin and the heat source. Leave the heat on for 20-30 minutes. Remove the heat if your skin turns bright red. This is especially important if you are unable to feel pain, heat, or cold. You have a greater risk of getting burned. General instructions If you were given a sedative during the procedure, it can affect you for several hours. Do not drive or operate machinery until your health care provider  says that it is safe. For the first 24 hours after the procedure: Do not sign important documents. Do not drink alcohol. Do your regular daily activities at a slower pace than normal. Eat soft foods that are easy to digest. Take over-the-counter and prescription medicines only as told by your health care provider. Keep all follow-up visits. This is important. Contact a health care provider if: You have blood in your stool 2-3 days after the procedure. Get help right away if: You have more than a small spotting of blood in your stool. You have large blood clots in your stool. You have swelling of your abdomen. You have nausea or vomiting. You have a fever. You have increasing pain in your abdomen that is not relieved with medicine. These  symptoms may be an emergency. Get help right away. Call 911. Do not wait to see if the symptoms will go away. Do not drive yourself to the hospital. Summary After the procedure, it is common to have a small amount of blood in your stool. You may also have mild cramping and bloating of your abdomen. If you were given a sedative during the procedure, it can affect you for several hours. Do not drive or operate machinery until your health care provider says that it is safe. Get help right away if you have a lot of blood in your stool, nausea or vomiting, a fever, or increased pain in your abdomen. This information is not intended to replace advice given to you by your health care provider. Make sure you discuss any questions you have with your health care provider. Document Revised: 08/06/2022 Document Reviewed: 02/14/2021 Elsevier Patient Education  2024 Elsevier Inc. Monitored Anesthesia Care, Care After The following information offers guidance on how to care for yourself after your procedure. Your health care provider may also give you more specific instructions. If you have problems or questions, contact your health care provider. What can I expect  after the procedure? After the procedure, it is common to have: Tiredness. Little or no memory about what happened during or after the procedure. Impaired judgment when it comes to making decisions. Nausea or vomiting. Some trouble with balance. Follow these instructions at home: For the time period you were told by your health care provider:  Rest. Do not participate in activities where you could fall or become injured. Do not drive or use machinery. Do not drink alcohol. Do not take sleeping pills or medicines that cause drowsiness. Do not make important decisions or sign legal documents. Do not take care of children on your own. Medicines Take over-the-counter and prescription medicines only as told by your health care provider. If you were prescribed antibiotics, take them as told by your health care provider. Do not stop using the antibiotic even if you start to feel better. Eating and drinking Follow instructions from your health care provider about what you may eat and drink. Drink enough fluid to keep your urine pale yellow. If you vomit: Drink clear fluids slowly and in small amounts as you are able. Clear fluids include water, ice chips, low-calorie sports drinks, and fruit juice that has water added to it (diluted fruit juice). Eat light and bland foods in small amounts as you are able. These foods include bananas, applesauce, rice, lean meats, toast, and crackers. General instructions  Have a responsible adult stay with you for the time you are told. It is important to have someone help care for you until you are awake and alert. If you have sleep apnea, surgery and some medicines can increase your risk for breathing problems. Follow instructions from your health care provider about wearing your sleep device: When you are sleeping. This includes during daytime naps. While taking prescription pain medicines, sleeping medicines, or medicines that make you drowsy. Do not use  any products that contain nicotine or tobacco. These products include cigarettes, chewing tobacco, and vaping devices, such as e-cigarettes. If you need help quitting, ask your health care provider. Contact a health care provider if: You feel nauseous or vomit every time you eat or drink. You feel light-headed. You are still sleepy or having trouble with balance after 24 hours. You get a rash. You have a fever. You have redness or swelling around the IV site.  Get help right away if: You have trouble breathing. You have new confusion after you get home. These symptoms may be an emergency. Get help right away. Call 911. Do not wait to see if the symptoms will go away. Do not drive yourself to the hospital. This information is not intended to replace advice given to you by your health care provider. Make sure you discuss any questions you have with your health care provider. Document Revised: 11/19/2021 Document Reviewed: 11/19/2021 Elsevier Patient Education  2024 [employer].

## 2023-01-28 ENCOUNTER — Other Ambulatory Visit: Payer: Self-pay

## 2023-01-28 ENCOUNTER — Encounter (HOSPITAL_COMMUNITY)
Admission: RE | Admit: 2023-01-28 | Discharge: 2023-01-28 | Disposition: A | Discharge status: Home / Self Care | Payer: Self-pay | Source: Ambulatory Visit | Attending: Gastroenterology | Admitting: Gastroenterology

## 2023-01-28 ENCOUNTER — Encounter (HOSPITAL_COMMUNITY): Payer: Self-pay

## 2023-01-28 DIAGNOSIS — R9431 Abnormal electrocardiogram [ECG] [EKG]: Secondary | ICD-10-CM | POA: Insufficient documentation

## 2023-01-28 DIAGNOSIS — Z01810 Encounter for preprocedural cardiovascular examination: Secondary | ICD-10-CM | POA: Insufficient documentation

## 2023-01-28 DIAGNOSIS — Z6841 Body Mass Index (BMI) 40.0 and over, adult: Secondary | ICD-10-CM | POA: Insufficient documentation

## 2023-01-29 ENCOUNTER — Telehealth (INDEPENDENT_AMBULATORY_CARE_PROVIDER_SITE_OTHER): Payer: Self-pay | Admitting: *Deleted

## 2023-01-29 NOTE — Telephone Encounter (Signed)
Pt called and states she has a slight headache today.having egd tomorrow and wanted to know what she could take for headache. I let her know she could take tylenol and not to take anything with aspirin in it. She verbalized understanding.

## 2023-01-30 ENCOUNTER — Ambulatory Visit (HOSPITAL_BASED_OUTPATIENT_CLINIC_OR_DEPARTMENT_OTHER): Payer: Self-pay | Admitting: Anesthesiology

## 2023-01-30 ENCOUNTER — Ambulatory Visit (HOSPITAL_COMMUNITY)
Admission: RE | Admit: 2023-01-30 | Discharge: 2023-01-30 | Disposition: A | Discharge status: Home / Self Care | Payer: Self-pay | Attending: Gastroenterology | Admitting: Gastroenterology

## 2023-01-30 ENCOUNTER — Encounter (HOSPITAL_COMMUNITY)
Admission: RE | Disposition: A | Discharge status: Home / Self Care | Payer: Self-pay | Source: Home / Self Care | Attending: Gastroenterology

## 2023-01-30 ENCOUNTER — Ambulatory Visit (HOSPITAL_COMMUNITY): Payer: Self-pay | Admitting: Anesthesiology

## 2023-01-30 ENCOUNTER — Encounter (HOSPITAL_COMMUNITY): Payer: Self-pay

## 2023-01-30 DIAGNOSIS — K635 Polyp of colon: Secondary | ICD-10-CM | POA: Insufficient documentation

## 2023-01-30 DIAGNOSIS — Z1211 Encounter for screening for malignant neoplasm of colon: Secondary | ICD-10-CM

## 2023-01-30 DIAGNOSIS — B9681 Helicobacter pylori [H. pylori] as the cause of diseases classified elsewhere: Secondary | ICD-10-CM | POA: Insufficient documentation

## 2023-01-30 DIAGNOSIS — K3189 Other diseases of stomach and duodenum: Secondary | ICD-10-CM

## 2023-01-30 DIAGNOSIS — K2950 Unspecified chronic gastritis without bleeding: Secondary | ICD-10-CM | POA: Insufficient documentation

## 2023-01-30 DIAGNOSIS — D509 Iron deficiency anemia, unspecified: Secondary | ICD-10-CM | POA: Insufficient documentation

## 2023-01-30 DIAGNOSIS — Z6841 Body Mass Index (BMI) 40.0 and over, adult: Secondary | ICD-10-CM | POA: Insufficient documentation

## 2023-01-30 DIAGNOSIS — K644 Residual hemorrhoidal skin tags: Secondary | ICD-10-CM | POA: Insufficient documentation

## 2023-01-30 DIAGNOSIS — E669 Obesity, unspecified: Secondary | ICD-10-CM | POA: Insufficient documentation

## 2023-01-30 DIAGNOSIS — K2970 Gastritis, unspecified, without bleeding: Secondary | ICD-10-CM

## 2023-01-30 DIAGNOSIS — K648 Other hemorrhoids: Secondary | ICD-10-CM | POA: Insufficient documentation

## 2023-01-30 DIAGNOSIS — D125 Benign neoplasm of sigmoid colon: Secondary | ICD-10-CM

## 2023-01-30 HISTORY — PX: BIOPSY: SHX5522

## 2023-01-30 HISTORY — PX: POLYPECTOMY: SHX5525

## 2023-01-30 HISTORY — PX: ESOPHAGOGASTRODUODENOSCOPY (EGD) WITH PROPOFOL: SHX5813

## 2023-01-30 HISTORY — PX: COLONOSCOPY WITH PROPOFOL: SHX5780

## 2023-01-30 SURGERY — ESOPHAGOGASTRODUODENOSCOPY (EGD) WITH PROPOFOL
Anesthesia: General

## 2023-01-30 MED ORDER — PROPOFOL 500 MG/50ML IV EMUL
INTRAVENOUS | Status: DC | PRN
  Administered 2023-01-30: 120 ug/kg/min via INTRAVENOUS

## 2023-01-30 MED ORDER — PROPOFOL 10 MG/ML IV BOLUS
INTRAVENOUS | Status: DC | PRN
  Administered 2023-01-30: 30 mg via INTRAVENOUS
  Administered 2023-01-30: 20 mg via INTRAVENOUS
  Administered 2023-01-30: 140 mg via INTRAVENOUS
  Administered 2023-01-30: 50 mg via INTRAVENOUS
  Administered 2023-01-30: 30 mg via INTRAVENOUS
  Administered 2023-01-30 (×2): 20 mg via INTRAVENOUS

## 2023-01-30 MED ORDER — LIDOCAINE HCL 1 % IJ SOLN
INTRAMUSCULAR | Status: DC | PRN
  Administered 2023-01-30: 50 mg via INTRADERMAL

## 2023-01-30 MED ORDER — STERILE WATER FOR IRRIGATION IR SOLN
Status: DC | PRN
  Administered 2023-01-30: 60 mL

## 2023-01-30 MED ORDER — LACTATED RINGERS IV SOLN: INTRAVENOUS | Status: DC

## 2023-01-30 MED ORDER — LACTATED RINGERS IV SOLN: INTRAVENOUS | Status: DC | PRN

## 2023-01-30 NOTE — Transfer of Care (Signed)
Immediate Anesthesia Transfer of Care Note  Patient: Youth worker  Procedure(s) Performed: ESOPHAGOGASTRODUODENOSCOPY (EGD) WITH PROPOFOL COLONOSCOPY WITH PROPOFOL BIOPSY POLYPECTOMY  Patient Location: Short Stay  Anesthesia Type:General  Level of Consciousness: awake  Airway & Oxygen Therapy: Patient Spontanous Breathing  Post-op Assessment: Report given to RN  Post vital signs: Reviewed and stable  Last Vitals:  Vitals Value Taken Time  BP    Temp    Pulse    Resp    SpO2      Last Pain:  Vitals:   01/30/23 1458  TempSrc: Oral  PainSc: 0-No pain         Complications: No notable events documented.

## 2023-01-30 NOTE — Anesthesia Postprocedure Evaluation (Signed)
Anesthesia Post Note  Patient: Youth worker  Procedure(s) Performed: ESOPHAGOGASTRODUODENOSCOPY (EGD) WITH PROPOFOL COLONOSCOPY WITH PROPOFOL BIOPSY POLYPECTOMY  Patient location during evaluation: Short Stay Anesthesia Type: General Level of consciousness: awake and alert Pain management: pain level controlled Vital Signs Assessment: post-procedure vital signs reviewed and stable Respiratory status: spontaneous breathing Cardiovascular status: blood pressure returned to baseline and stable Postop Assessment: no apparent nausea or vomiting Anesthetic complications: no   No notable events documented.   Last Vitals:  Vitals:   01/30/23 1221 01/30/23 1458  BP: 121/85 122/81  Pulse: 69 64  Resp: 16 18  Temp: 36.7 C 36.4 C  SpO2: 100% 100%    Last Pain:  Vitals:   01/30/23 1458  TempSrc: Oral  PainSc: 0-No pain                 Amberlynn Tempesta

## 2023-01-30 NOTE — Op Note (Signed)
[facility] Patient Name: Stephanie Kidd Procedure Date: 01/30/2023 2:11 PM MRN: [id number] Date of Birth: 1970-10-03 Attending MD: Sanjuan Dame , MD, [id number] CSN: [id number] Age: 52 Admit Type: Outpatient Procedure:                Colonoscopy Indications:              Screening for colorectal malignant neoplasm Providers:                Sanjuan Dame, MD, Angelica Ran, Elinor Parkinson,                            Dyann Ruddle Referring MD:              Medicines:                Monitored Anesthesia Care Complications:            No immediate complications. Estimated Blood Loss:     Estimated blood loss: none. Procedure:                Pre-Anesthesia Assessment:                           - Prior to the procedure, a History and Physical                            was performed, and patient medications and                            allergies were reviewed. The patient's tolerance of                            previous anesthesia was also reviewed. The risks                            and benefits of the procedure and the sedation                            options and risks were discussed with the patient.                            All questions were answered, and informed consent                            was obtained. Prior Anticoagulants: The patient has                            taken no anticoagulant or antiplatelet agents. ASA                            Grade Assessment: III - A patient with severe                            systemic disease. After reviewing the risks and  benefits, the patient was deemed in satisfactory                            condition to undergo the procedure.                           After obtaining informed consent, the colonoscope                            was passed under direct vision. Throughout the                            procedure, the patient's blood pressure, pulse, and                            oxygen  saturations were monitored continuously. The                            [id number]) scope was introduced through the                            anus and advanced to the the cecum, identified by                            appendiceal orifice and ileocecal valve. The                            colonoscopy was performed without difficulty. The                            patient tolerated the procedure well. The quality                            of the bowel preparation was evaluated using the                            BBPS [facility] Bowel Preparation Scale) with scores                            of: Right Colon = 2 (minor amount of residual                            staining, small fragments of stool and/or opaque                            liquid, but mucosa seen well), Transverse Colon = 2                            (minor amount of residual staining, small fragments                            of stool and/or opaque liquid, but mucosa seen  well) and Left Colon = 2 (minor amount of residual                            staining, small fragments of stool and/or opaque                            liquid, but mucosa seen well). The total BBPS score                            equals 6. The ileocecal valve, appendiceal orifice,                            and rectum were photographed. Scope In: 2:41:53 PM Scope Out: 2:56:09 PM Scope Withdrawal Time: 0 hours 9 minutes 9 seconds  Total Procedure Duration: 0 hours 14 minutes 16 seconds  Findings:      A 3 mm polyp was found in the sigmoid colon. The polyp was sessile. The       polyp was removed with a cold biopsy forceps. Resection and retrieval       were complete.      Non-bleeding external internal hemorrhoids were found during       retroflexion. Impression:               - One 3 mm polyp in the sigmoid colon, removed with                            a cold biopsy forceps. Resected and retrieved.                            - Non-bleeding external internal hemorrhoids. Moderate Sedation:      Per Anesthesia Care Recommendation:           - Repeat colonoscopy for surveillance based on                            pathology results.                           - Return to GI office as previously scheduled. Procedure Code(s):        --- Professional ---                           [phone], Colonoscopy, flexible; with biopsy, single                            or multiple Diagnosis Code(s):        --- Professional ---                           Z12.11, Encounter for screening for malignant                            neoplasm of colon                           D12.5, Benign neoplasm of sigmoid colon  K64.4, Residual hemorrhoidal skin tags                           K64.8, Other hemorrhoids CPT copyright 2022 American Medical Association. All rights reserved. The codes documented in this report are preliminary and upon coder review may  be revised to meet current compliance requirements. Sanjuan Dame, MD Sanjuan Dame, MD 01/30/2023 3:06:13 PM This report has been signed electronically. Number of Addenda: 0

## 2023-01-30 NOTE — Op Note (Signed)
[facility] Patient Name: Stephanie Kidd Procedure Date: 01/30/2023 2:17 PM MRN: [id number] Date of Birth: 11/27/1970 Attending MD: Sanjuan Dame , MD, [id number] CSN: [id number] Age: 52 Admit Type: Outpatient Procedure:                Upper GI endoscopy Indications:              Iron deficiency anemia Providers:                Sanjuan Dame, MD, Angelica Ran, Elinor Parkinson,                            Dyann Ruddle Referring MD:              Medicines:                Monitored Anesthesia Care Complications:            No immediate complications. Estimated Blood Loss:     Estimated blood loss was minimal. Procedure:                Pre-Anesthesia Assessment:                           - Prior to the procedure, a History and Physical                            was performed, and patient medications and                            allergies were reviewed. The patient's tolerance of                            previous anesthesia was also reviewed. The risks                            and benefits of the procedure and the sedation                            options and risks were discussed with the patient.                            All questions were answered, and informed consent                            was obtained. Prior Anticoagulants: The patient has                            taken no anticoagulant or antiplatelet agents. ASA                            Grade Assessment: III - A patient with severe                            systemic disease. After reviewing the risks and  benefits, the patient was deemed in satisfactory                            condition to undergo the procedure.                           After obtaining informed consent, the endoscope was                            passed under direct vision. Throughout the                            procedure, the patient's blood pressure, pulse, and                            oxygen saturations  were monitored continuously. The                            [id number]-H190 ([id number]) scope was introduced through the                            mouth, and advanced to the second part of duodenum.                            The upper GI endoscopy was accomplished without                            difficulty. The patient tolerated the procedure                            well. Scope In: 2:29:25 PM Scope Out: 2:38:04 PM Total Procedure Duration: 0 hours 8 minutes 39 seconds  Findings:      Esophagogastric landmarks were identified: the Z-line was found at 40 cm       and the gastroesophageal junction was found at 40 cm from the incisors.      Erythematous mucosa without bleeding was found in the stomach. Biopsies       were taken with a cold forceps for histology.      A single 7 mm papule (nodule) with no stigmata of recent bleeding was       found in the gastric antrum. Biopsies were taken with a cold forceps for       histology.      The duodenal bulb and second portion of the duodenum were normal. Impression:               - Esophagogastric landmarks identified.                           - Erythematous mucosa in the stomach. Biopsied.                           - A single papule (nodule) found in the stomach.                            Biopsied.                           -  Normal duodenal bulb and second portion of the                            duodenum. Moderate Sedation:      Per Anesthesia Care Recommendation:           - Patient has a contact number available for                            emergencies. The signs and symptoms of potential                            delayed complications were discussed with the                            patient. Return to normal activities tomorrow.                            Written discharge instructions were provided to the                            patient.                           - Resume previous diet.                           - Continue  present medications.                           - Await pathology results.                           - Depending on path results may consider EUS for                            antral nodule Procedure Code(s):        --- Professional ---                           [id number], Esophagogastroduodenoscopy, flexible,                            transoral; with biopsy, single or multiple Diagnosis Code(s):        --- Professional ---                           K31.89, Other diseases of stomach and duodenum                           D50.9, Iron deficiency anemia, unspecified CPT copyright 2022 American Medical Association. All rights reserved. The codes documented in this report are preliminary and upon coder review may  be revised to meet current compliance requirements. Sanjuan Dame, MD Sanjuan Dame, MD 01/30/2023 3:03:45 PM This report has been signed electronically. Number of Addenda: 0

## 2023-01-30 NOTE — Discharge Instructions (Addendum)

## 2023-01-30 NOTE — Interval H&P Note (Signed)
History and Physical Interval Note:  01/30/2023 1:08 PM  Stephanie Kidd  has presented today for surgery, with the diagnosis of microcytic anemia.  The various methods of treatment have been discussed with the patient and family. After consideration of risks, benefits and other options for treatment, the patient has consented to  Procedure(s) with comments: ESOPHAGOGASTRODUODENOSCOPY (EGD) WITH PROPOFOL (N/A) - 1:30pm;asa 3 COLONOSCOPY WITH PROPOFOL (N/A) - 1:30pm;asa 3 as a surgical intervention.  The patient's history has been reviewed, patient examined, no change in status, stable for surgery.  I have reviewed the patient's chart and labs.  Questions were answered to the patient's satisfaction.     Stephanie Kidd [name]

## 2023-01-30 NOTE — Anesthesia Preprocedure Evaluation (Signed)
Anesthesia Evaluation  Patient identified by MRN, date of birth, ID band Patient awake    Reviewed: Allergy & Precautions, H&P , NPO status , Patient's Chart, lab work & pertinent test results, reviewed documented beta blocker date and time   Airway Mallampati: II  TM Distance: >3 FB Neck ROM: full    Dental no notable dental hx.    Pulmonary neg pulmonary ROS   Pulmonary exam normal breath sounds clear to auscultation       Cardiovascular Exercise Tolerance: Good negative cardio ROS  Rhythm:regular Rate:Normal     Neuro/Psych negative neurological ROS  negative psych ROS   GI/Hepatic negative GI ROS, Neg liver ROS,,,  Endo/Other  negative endocrine ROS    Renal/GU negative Renal ROS  negative genitourinary   Musculoskeletal   Abdominal   Peds  Hematology negative hematology ROS (+) Blood dyscrasia, anemia   Anesthesia Other Findings   Reproductive/Obstetrics negative OB ROS                             Anesthesia Physical Anesthesia Plan  ASA: 2  Anesthesia Plan: General   Post-op Pain Management:    Induction:   PONV Risk Score and Plan: Propofol infusion  Airway Management Planned:   Additional Equipment:   Intra-op Plan:   Post-operative Plan:   Informed Consent: I have reviewed the patients History and Physical, chart, labs and discussed the procedure including the risks, benefits and alternatives for the proposed anesthesia with the patient or authorized representative who has indicated his/her understanding and acceptance.     Dental Advisory Given  Plan Discussed with: CRNA  Anesthesia Plan Comments:        Anesthesia Quick Evaluation

## 2023-02-04 ENCOUNTER — Encounter (HOSPITAL_COMMUNITY): Payer: Self-pay | Admitting: Gastroenterology

## 2023-02-05 ENCOUNTER — Other Ambulatory Visit (HOSPITAL_COMMUNITY): Payer: Self-pay | Admitting: Gastroenterology

## 2023-02-05 MED ORDER — PANTOPRAZOLE SODIUM 40 MG PO TBEC: 40.0000 mg | DELAYED_RELEASE_TABLET | Freq: Two times a day (BID) | ORAL | 0 refills | Status: DC

## 2023-02-05 MED ORDER — AMOXICILLIN 500 MG PO TABS: 1000.0000 mg | ORAL_TABLET | Freq: Two times a day (BID) | ORAL | 0 refills | Status: AC

## 2023-02-05 MED ORDER — CLARITHROMYCIN 500 MG PO TABS: 500.0000 mg | ORAL_TABLET | Freq: Two times a day (BID) | ORAL | 0 refills | Status: AC

## 2023-02-05 NOTE — Progress Notes (Signed)
I reviewed the pathology results. Ann, can you send her a letter with the findings as described below please? Repeat colonoscopy in 10 years  Thanks,  Vista Lawman, MD Gastroenterology and Hepatology [facility]  ---------------------------------------------------------------------------------------------  [facility] [address], [address], [place], [place] [address] Phone:  [phone]   02/05/23 [place], [place]   Dear Stephanie Kidd,  I am writing to inform you that the biopsies taken during your recent endoscopic examination showed:  Hyperplastic Polyp  I am writing to let you know the results of your recent colonoscopy.  You had a total of 1 polyp removed. The pathology came back as "hyperplastic polyp." These findings are NOT cancer and do not turn into cancer. Given these findings, it is recommended that your next colonoscopy be performed in 10 years.   Also the results of your recent endoscopy (EGD).  You were found to have an infection called H. pylori, which is a bacteria that lives in the stomach. We will send you a pack of medications to take for 14 days: 2 antibiotics and an acid blocking medication pantoprazole. Take them all twice a day. It is VERY IMPORTANT that you take all of the medications as directed. If the infection is not fully treated, in can increase your risk of stomach cancer.   Please call us at [phone] if you have persistent problems or have questions about your condition that have not been fully answered at this time.  Sincerely,  Vista Lawman, MD Gastroenterology and Hepatology

## 2023-02-06 ENCOUNTER — Other Ambulatory Visit (INDEPENDENT_AMBULATORY_CARE_PROVIDER_SITE_OTHER): Payer: Self-pay | Admitting: *Deleted

## 2023-02-06 ENCOUNTER — Telehealth (INDEPENDENT_AMBULATORY_CARE_PROVIDER_SITE_OTHER): Payer: Self-pay | Admitting: *Deleted

## 2023-02-06 ENCOUNTER — Encounter (INDEPENDENT_AMBULATORY_CARE_PROVIDER_SITE_OTHER): Payer: Self-pay | Admitting: *Deleted

## 2023-02-06 NOTE — Telephone Encounter (Signed)
Discussed with patient per Dr. Tasia Catchings - I would not take Diflucan with these antibiotics as there is drug-drug interactions. Also for husband to be tested and treated it depends if he is having any symptoms and can see a gastroenterologist to assess if te sting is needed   Patient verbalized understanding.

## 2023-02-06 NOTE — Telephone Encounter (Signed)
Pt called and wanted to discuss results of biopsy. I read her the result letter and let her know she would also be receiving it in the mail soon as it was mailed yesterday.    She has picked up her 3 meds for h pylori and understands the results. She is requesting rx for diflucan to be called in. States everytime she takes antibiotics she gets a yeast infection. I asked her to just let us know if she starts to have symptoms and she wanted me to ask for diflucan because she knows she will get one and would like to have it now.  ( She also wanted to know if her husband needed to be tested for h pylori and I let her know not unless he is having symptoms and she states she was not having symptoms either)

## 2023-02-11 ENCOUNTER — Encounter (INDEPENDENT_AMBULATORY_CARE_PROVIDER_SITE_OTHER): Payer: Self-pay | Admitting: Gastroenterology

## 2023-02-11 NOTE — Progress Notes (Signed)
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 Biopsies were negative  After review of the endoscopic pictures with Dr Meridee Score ; it is reasonable to either repeat EGD in an year to ensure stability of the nodule or refer the patient for EUS at that time

## 2023-02-18 ENCOUNTER — Telehealth: Payer: Self-pay

## 2023-02-18 NOTE — Telephone Encounter (Signed)
Attempted follow up and wellness call to [facility] client from [facility].  No answer left voicemail requesting return call.  PCP [facility] Next appt 04/08/23.   Francee Nodal RN [employer]

## 2023-08-13 IMAGING — MG MM DIGITAL SCREENING BILAT W/ TOMO AND CAD
6 of 12 series · 6 of 36 positions shown · non-contrast
Comparison: Previous exam(s).

CLINICAL DATA: Screening.

EXAM:
DIGITAL SCREENING BILATERAL MAMMOGRAM WITH TOMOSYNTHESIS AND CAD
TECHNIQUE: Bilateral screening digital craniocaudal and mediolateral oblique
mammograms were obtained. Bilateral screening digital breast
tomosynthesis was performed. The images were evaluated with
computer-aided detection.

[R CC synth-2D (1 of 2)]
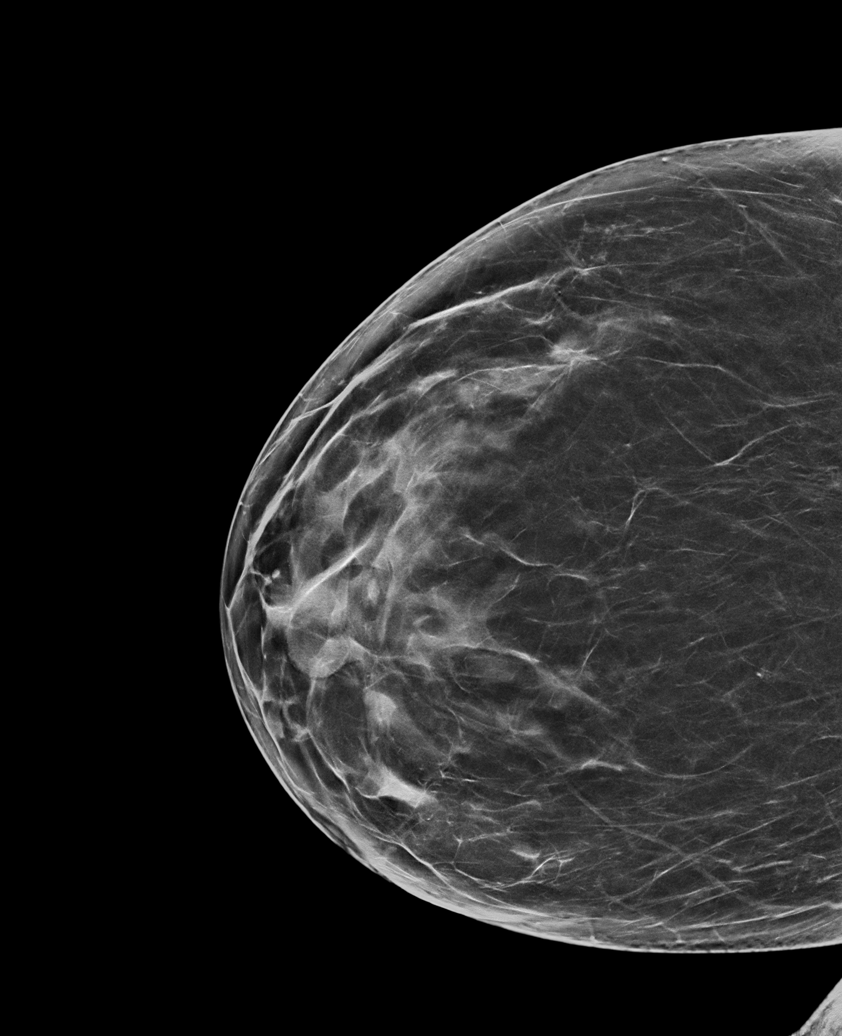

[L CC synth-2D (1 of 2)]
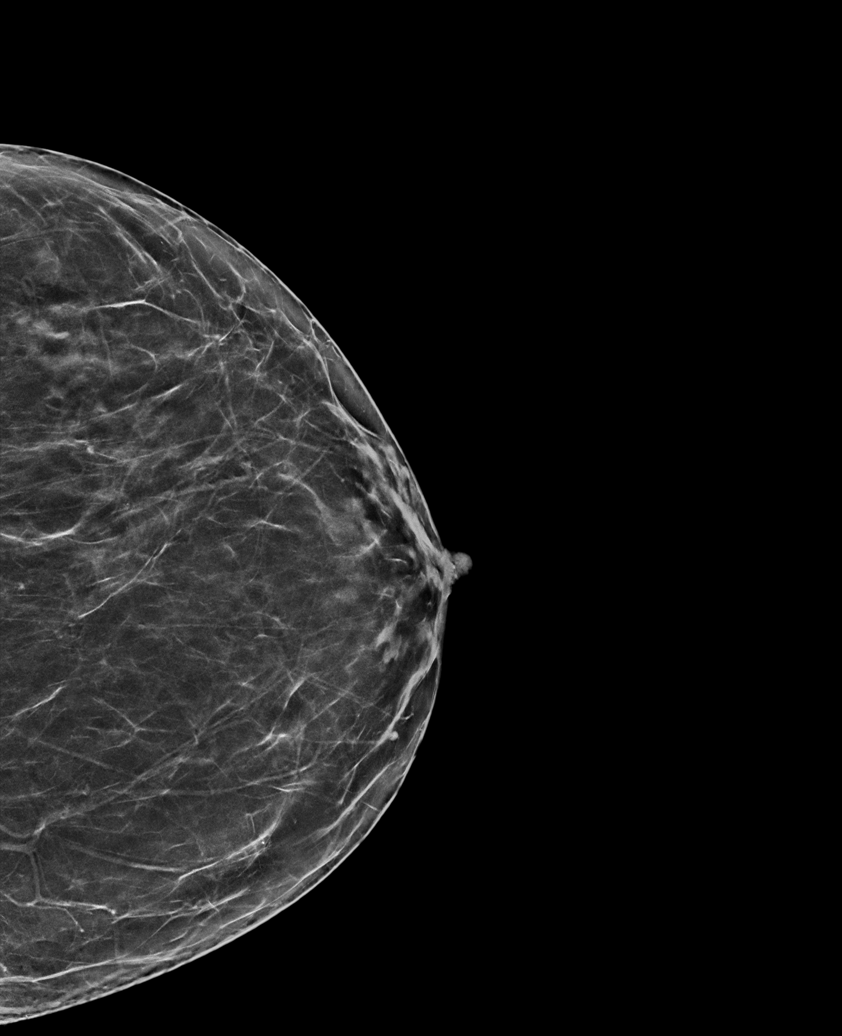

[L CC synth-2D (2 of 2)]
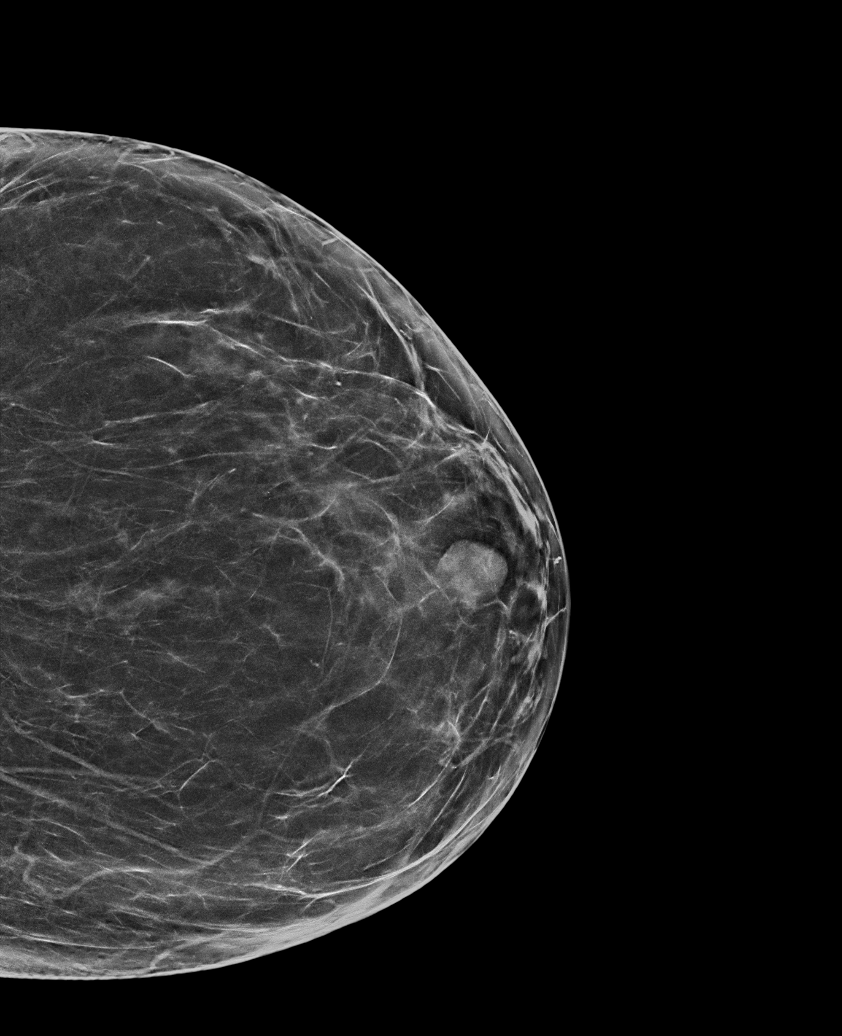

[R MLO synth-2D]
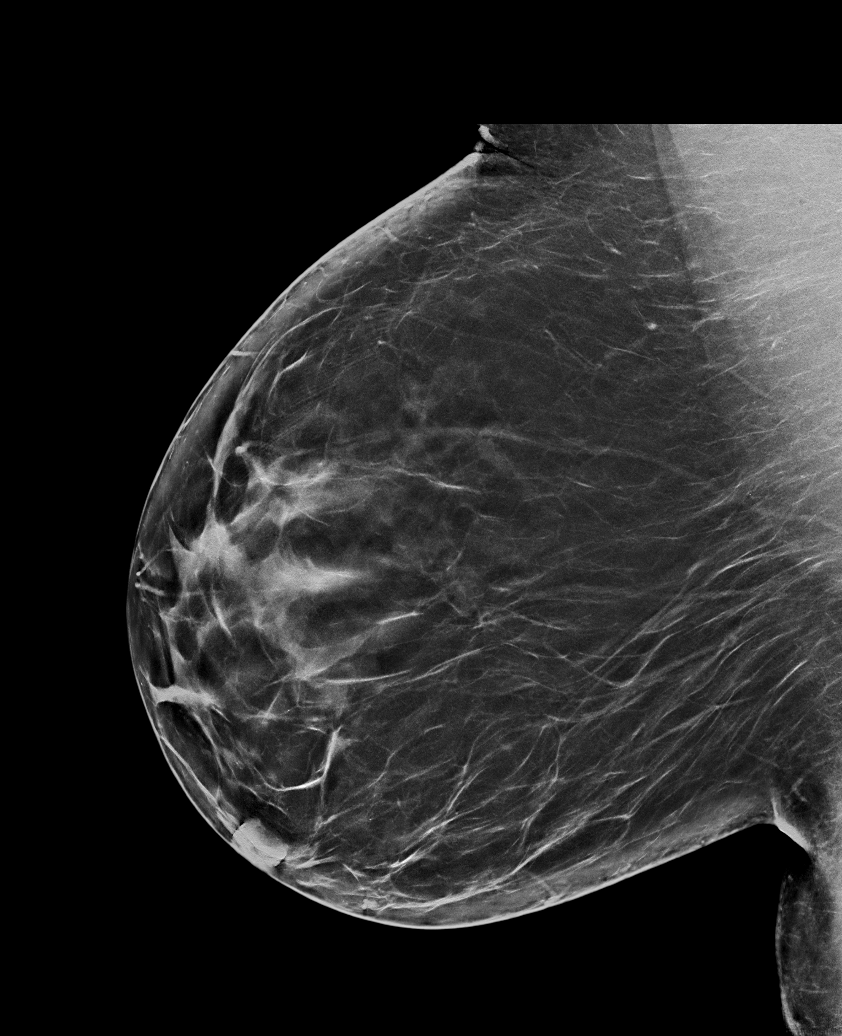

[L MLO synth-2D]
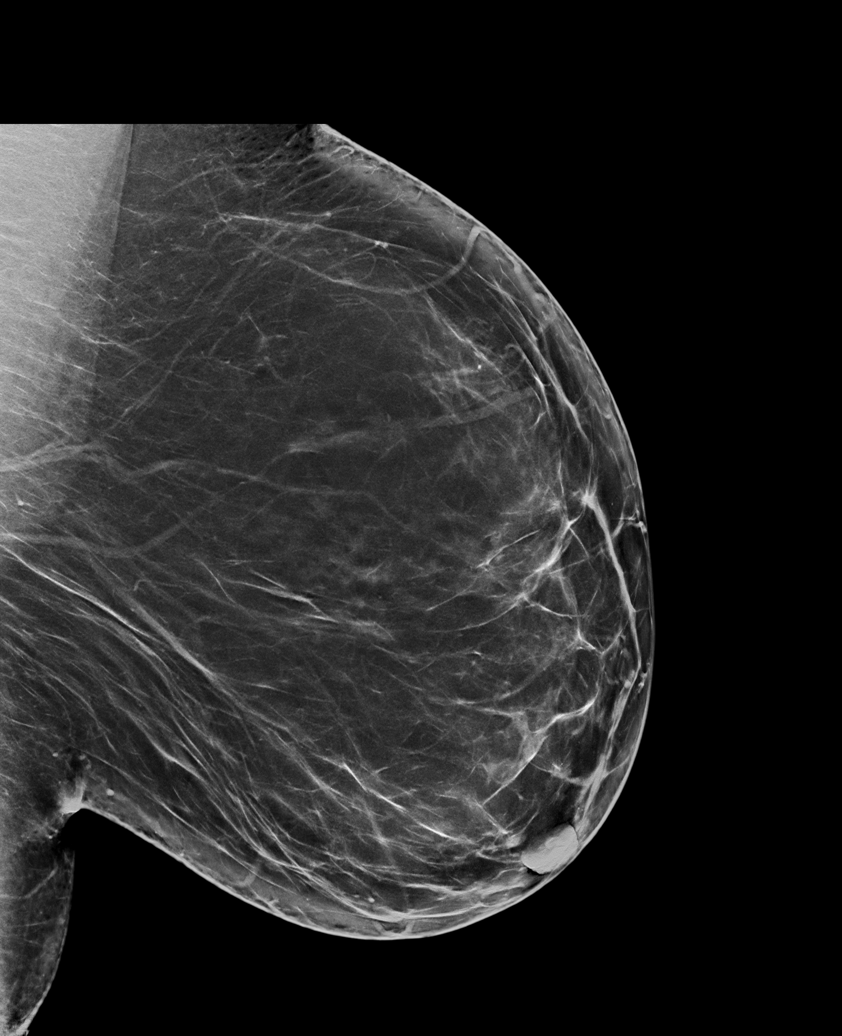

[R CC synth-2D (2 of 2)]
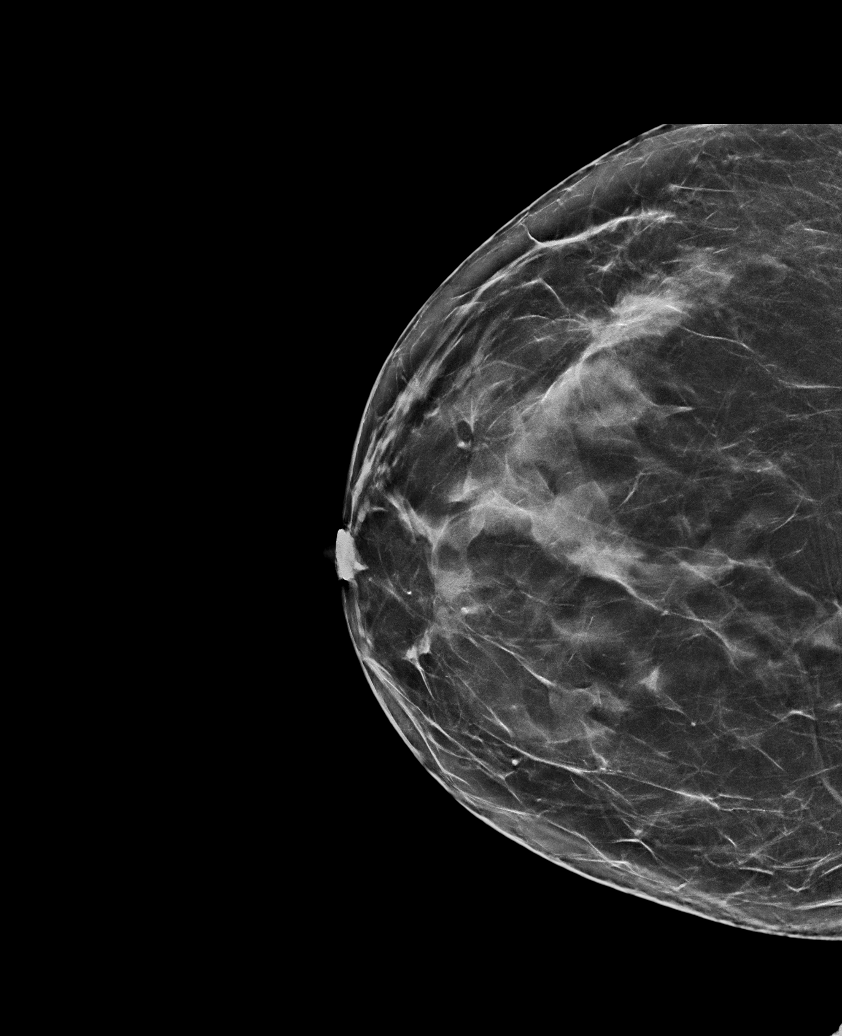

[6 of 36 positions shown; findings below may reference images not displayed]

ACR Breast Density Category b: There are scattered areas of
fibroglandular density.
FINDINGS: There are no findings suspicious for malignancy.
IMPRESSION: No mammographic evidence of malignancy. A result letter of this
screening mammogram will be mailed directly to the patient.

RECOMMENDATION:
Screening mammogram in one year. (Code:51-O-LD2)

BI-RADS CATEGORY  1: Negative.

## 2023-11-19 ENCOUNTER — Encounter: Payer: Self-pay | Admitting: Surgical

## 2023-11-19 ENCOUNTER — Other Ambulatory Visit: Payer: Self-pay

## 2023-11-19 ENCOUNTER — Ambulatory Visit (INDEPENDENT_AMBULATORY_CARE_PROVIDER_SITE_OTHER): Payer: Self-pay | Admitting: Surgical

## 2023-11-19 DIAGNOSIS — M25562 Pain in left knee: Secondary | ICD-10-CM

## 2023-11-19 DIAGNOSIS — M25462 Effusion, left knee: Secondary | ICD-10-CM

## 2023-11-19 MED ORDER — LIDOCAINE HCL 1 % IJ SOLN
5.0000 mL | INTRAMUSCULAR | Status: AC | PRN
Start: 2023-11-19 — End: 2023-11-19
  Administered 2023-11-19: 5 mL

## 2023-11-19 MED ORDER — BUPIVACAINE HCL 0.25 % IJ SOLN
4.0000 mL | INTRAMUSCULAR | Status: AC | PRN
Start: 2023-11-19 — End: 2023-11-19
  Administered 2023-11-19: 4 mL via INTRA_ARTICULAR

## 2023-11-19 MED ORDER — TRIAMCINOLONE ACETONIDE 40 MG/ML IJ SUSP
40.0000 mg | INTRAMUSCULAR | Status: AC | PRN
Start: 2023-11-19 — End: 2023-11-19
  Administered 2023-11-19: 40 mg via INTRA_ARTICULAR

## 2023-11-19 NOTE — Progress Notes (Signed)
 Office Visit Note   Patient: Stephanie Kidd           Date of Birth: [birth year]           MRN: [id number] Visit Date: 11/19/2023 Requested by: Bolivar Bushman., PA-C [address] [place],  [place] [address] PCP: Bolivar Bushman., PA-C  Subjective: Chief Complaint  Patient presents with   Left Knee - Pain    HPI: Stephanie Kidd is a 53 y.o. female who presents to the office reporting left knee pain.  Patient states that she has developed left knee pain over the last week without any specific injury event.  She has had on and off pain in the left knee but never this bad.  Localizes pain primarily to the medial aspect of the knee.  She feels like it started when she was staying in the "SICU house" at [facility] while receiving care for her husband's leukemia.  She felt that the bed in that facility was very hard and rough and caused increased knee pain.  Since onset of symptoms, she has tried RICE and ibuprofen.  No associated groin pain.  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  Overall her hips are not really causing her any significant discomfort and she has seen a different provider for this problem.  No history of prior surgery to the knee.  Has occasional swelling.  Describes throbbing "like a toothache".                ROS: All systems reviewed are negative as they relate to the chief complaint within the history of present illness.  Patient denies fevers or chills.  Assessment & Plan: Visit Diagnoses:  1. Effusion, left knee   2. Acute pain of left knee     Plan: Patient is a 53 year old female who presents for evaluation of left knee pain.  She has symptoms ongoing for the last week with no significant mechanical symptoms though she does have medial joint line tenderness and small effusion.  With no injury history and persistent knee pain that is keeping her from being able to help care for her husband, she would like to try left knee injection.  Denies any history  of diabetes.  Tolerated injection well and left knee was aspirated of 15cc of nonpurulent synovial fluid.  Recommended she give 1 to 2 weeks for this injection to take effect and if she has persistent pain or worsening symptoms, she can reach out and the next step would be MRI of the left knee for further evaluation of occult arthritis or medial meniscal tear.  Follow-Up Instructions: No follow-ups on file.   Orders:  Orders Placed This Encounter  Procedures   XR KNEE 3 VIEW LEFT   No orders of the defined types were placed in this encounter.     Procedures: Large Joint Inj: L knee on 11/19/2023 10:05 AM Indications: diagnostic evaluation, joint swelling and pain Details: 18 G 1.5 in needle, superolateral approach  Arthrogram: No  Medications: 5 mL lidocaine  1 %; 4 mL bupivacaine 0.25 %; 40 mg triamcinolone acetonide 40 MG/ML Aspirate: 15 mL Outcome: tolerated well, no immediate complications Procedure, treatment alternatives, risks and benefits explained, specific risks discussed. Consent was given by the patient. Immediately prior to procedure a time out was called to verify the correct patient, procedure, equipment, support staff and site/side marked as required. Patient was prepped and draped in the usual sterile fashion.  Clinical Data: No additional findings.  Objective: Vital Signs: LMP 11/12/2017   Physical Exam:  Constitutional: Patient appears well-developed HEENT:  Head: Normocephalic Eyes:EOM are normal Neck: Normal range of motion Cardiovascular: Normal rate Pulmonary/chest: Effort normal Neurologic: Patient is alert Skin: Skin is warm Psychiatric: Patient has normal mood and affect  Ortho Exam: Ortho exam demonstrates left knee with small effusion.  Tender over the medial joint line moderately and minimal tenderness over the lateral joint line.  She is able to perform straight leg raise without extensor lag.  She has range of motion from 0 degrees  extension to about 115 degrees of knee flexion.  No pain with hip range of motion.  No cellulitis or skin changes noted.  No calf tenderness.  Negative Homan sign.  Stable to anterior posterior drawer sign.  Stable to varus and valgus stress at 0 and 30 degrees.  Specialty Comments:  No specialty comments available.  Imaging: No results found.   PMFS History: Patient Active Problem List   Diagnosis Date Noted   Microcytic anemia 01/21/2023   Colon cancer screening 01/21/2023   Iron deficiency anemia due to chronic blood loss 01/21/2023   Severe obesity (BMI >= 40) (HCC) 01/21/2023   No past medical history on file.  Family History  Problem Relation Age of Onset   Breast cancer Mother 58   Hypertension Maternal Grandmother     Past Surgical History:  Procedure Laterality Date   BIOPSY  01/30/2023   Procedure: BIOPSY;  Surgeon: Hargis Lias, MD;  [facility];  Service: Endoscopy;;   CESAREAN SECTION  1989   COLONOSCOPY WITH PROPOFOL  N/A 01/30/2023   Procedure: COLONOSCOPY WITH PROPOFOL ;  Surgeon: Hargis Lias, MD;  Location: [facility];  Service: Endoscopy;  Laterality: N/A;  1:30pm;asa 3   ESOPHAGOGASTRODUODENOSCOPY (EGD) WITH PROPOFOL  N/A 01/30/2023   Procedure: ESOPHAGOGASTRODUODENOSCOPY (EGD) WITH PROPOFOL ;  Surgeon: Hargis Lias, MD;  Location: [facility];  Service: Endoscopy;  Laterality: N/A;  1:30pm;asa 3   POLYPECTOMY  01/30/2023   Procedure: POLYPECTOMY;  Surgeon: Hargis Lias, MD;  Location: [facility];  Service: Endoscopy;;   TUBAL LIGATION     Social History   Occupational History   Not on file  Tobacco Use   Smoking status: Never   Smokeless tobacco: Not on file  Vaping Use   Vaping status: Never Used  Substance and Sexual Activity   Alcohol use: No   Drug use: No   Sexual activity: Yes    Birth control/protection: Surgical

## 2024-03-15 ENCOUNTER — Other Ambulatory Visit: Payer: Self-pay | Admitting: Obstetrics and Gynecology

## 2024-03-15 DIAGNOSIS — Z1231 Encounter for screening mammogram for malignant neoplasm of breast: Secondary | ICD-10-CM

## 2024-03-19 ENCOUNTER — Ambulatory Visit (HOSPITAL_COMMUNITY)
Admission: RE | Admit: 2024-03-19 | Discharge: 2024-03-19 | Disposition: A | Discharge status: Home / Self Care | Source: Ambulatory Visit | Attending: Obstetrics and Gynecology | Admitting: Obstetrics and Gynecology

## 2024-03-19 ENCOUNTER — Inpatient Hospital Stay: Payer: Self-pay | Attending: Obstetrics and Gynecology | Admitting: *Deleted

## 2024-03-19 VITALS — BP 145/94 | Ht <= 58 in | Wt 172.6 lb

## 2024-03-19 DIAGNOSIS — Z1239 Encounter for other screening for malignant neoplasm of breast: Secondary | ICD-10-CM

## 2024-03-19 DIAGNOSIS — Z1231 Encounter for screening mammogram for malignant neoplasm of breast: Secondary | ICD-10-CM | POA: Insufficient documentation

## 2024-03-19 NOTE — Progress Notes (Signed)
 [name] is a 53 y.o. female who presents to [facility] clinic today with no complaints.    Pap Smear: Pap smear not completed today. Last Pap smear was 07/27/2021 at [facility] clinic and was normal with negative HPV. Per patient has no history of an abnormal Pap smear. Last Pap smear result is available in Epic.   Physical exam: Breasts Breasts symmetrical. No skin abnormalities bilateral breasts. No nipple retraction bilateral breasts. No nipple discharge bilateral breasts. No lymphadenopathy. No lumps palpated bilateral breasts. No complaints of pain or tenderness on exam.      MS 3D SCR MAMMO BILAT BR (aka MM) Result Date: 01/08/2023 CLINICAL DATA:  Screening. EXAM: DIGITAL SCREENING BILATERAL MAMMOGRAM WITH TOMOSYNTHESIS AND CAD TECHNIQUE: Bilateral screening digital craniocaudal and mediolateral oblique mammograms were obtained. Bilateral screening digital breast tomosynthesis was performed. The images were evaluated with computer-aided detection. COMPARISON:  Previous exam(s). ACR Breast Density Category b: There are scattered areas of fibroglandular density. FINDINGS: There are no findings suspicious for malignancy. IMPRESSION: No mammographic evidence of malignancy. A result letter of this screening mammogram will be mailed directly to the patient. RECOMMENDATION: Screening mammogram in one year. (Code:SM-B-01Y) BI-RADS CATEGORY  1: Negative. Electronically Signed   By: Delon Music M.D.   On: 01/08/2023 08:36   MS DIGITAL SCREENING TOMO BILATERAL Result Date: 07/27/2021 CLINICAL DATA:  Screening. EXAM: DIGITAL SCREENING BILATERAL MAMMOGRAM WITH TOMOSYNTHESIS AND CAD TECHNIQUE: Bilateral screening digital craniocaudal and mediolateral oblique mammograms were obtained. Bilateral screening digital breast tomosynthesis was performed. The images were evaluated with computer-aided detection. COMPARISON:  Previous exam(s). ACR Breast Density Category b: There are scattered areas of fibroglandular  density. FINDINGS: There are no findings suspicious for malignancy. IMPRESSION: No mammographic evidence of malignancy. A result letter of this screening mammogram will be mailed directly to the patient. RECOMMENDATION: Screening mammogram in one year. (Code:SM-B-01Y) BI-RADS CATEGORY  1: Negative. Electronically Signed   By: Toribio Agreste M.D.   On: 07/27/2021 13:39   MS DIGITAL DIAG TOMO UNI RIGHT Result Date: 06/06/2020 CLINICAL DATA:  Possible asymmetry in the outer right breast on a recent screening mammogram. EXAM: DIGITAL DIAGNOSTIC UNILATERAL RIGHT MAMMOGRAM WITH TOMO AND CAD COMPARISON:  Previous exam(s). ACR Breast Density Category b: There are scattered areas of fibroglandular density. FINDINGS: 3D tomographic and 2D generated true lateral and spot compression craniocaudal images of the right breast demonstrate normal appearing fibroglandular tissue at the location of the recently suspected asymmetry, unchanged compared previous examinations. Mammographic images were processed with CAD. IMPRESSION: No evidence of malignancy. The recently suspected right breast asymmetry was close apposition of normal breast tissue. RECOMMENDATION: Bilateral screening mammogram in 1 year. I have discussed the findings and recommendations with the patient. If applicable, a reminder letter will be sent to the patient regarding the next appointment. BI-RADS CATEGORY  1: Negative. Electronically Signed   By: Elspeth Bathe M.D.   On: 06/06/2020 08:41   MS DIGITAL SCREENING TOMO BILATERAL Result Date: 05/12/2020 CLINICAL DATA:  Screening. EXAM: DIGITAL SCREENING BILATERAL MAMMOGRAM WITH TOMO AND CAD COMPARISON:  Previous exam(s). ACR Breast Density Category b: There are scattered areas of fibroglandular density. FINDINGS: In the right breast, a possible asymmetry warrants further evaluation. In the left breast, no findings suspicious for malignancy. Images were processed with CAD. IMPRESSION: Further evaluation is  suggested for possible asymmetry in the right breast. RECOMMENDATION: Diagnostic mammogram and possibly ultrasound of the right breast. (Code:FI-R-10M) The patient will be contacted regarding the findings, and additional imaging will  be scheduled. BI-RADS CATEGORY  0: Incomplete. Need additional imaging evaluation and/or prior mammograms for comparison. Electronically Signed   By: Dobrinka  Dimitrova M.D.   On: 05/12/2020 09:37    Pelvic/Bimanual Pap is not indicated today per BCCCP guidelines.   Smoking History: Patient has never smoked.  Patient Navigation: Patient education provided. Access to services provided for patient through [facility] program.   Colorectal Cancer Screening: Per patient has had colonoscopy completed on 01/30/2023. No complaints today.    Breast and Cervical Cancer Risk Assessment: Patient has family history of her mother having breast cancer. Patient has no known genetic mutations or history of radiation treatment to the chest before age 74. Patient does not have history of cervical dysplasia, immunocompromised, or DES exposure in-utero.   Risk Assessment   No risk assessment data for the current encounter  Risk Scores       07/27/2021   Last edited by: Rogerio Tempie SQUIBB, LPN   5-year risk: 1.9%   Lifetime risk: 13.3%            A: BCCCP exam without pap smear No complaints.  P: Referred patient to [facility] for a screening mammogram. Appointment scheduled Friday, March 19, 2024 at 1345.  Driscilla Wanda SQUIBB, RN 03/19/2024 12:17 PM

## 2024-03-19 NOTE — Patient Instructions (Signed)
 Explained breast self awareness with Hortence Erdmann. Patient did not need a Pap smear today due to last Pap smear and HPV typing was 07/27/2021. Let her know BCCCP will cover Pap smears and HPV typing every 5 years unless has a history of abnormal Pap smears. Referred patient to [facility] for a screening mammogram. Appointment scheduled Friday, March 19, 2024 at 1345. Patient aware of appointment and will be there. Let patient know [facility] will follow up with her within the next couple weeks with results of her mammogram by letter or phone. Tyrah Kenedy verbalized understanding.  Jacorion Klem, Wanda Ship, RN 12:17 PM

## 2024-05-03 ENCOUNTER — Encounter (INDEPENDENT_AMBULATORY_CARE_PROVIDER_SITE_OTHER): Payer: Self-pay | Admitting: *Deleted

## 2024-05-07 ENCOUNTER — Other Ambulatory Visit: Payer: Self-pay

## 2024-05-07 ENCOUNTER — Emergency Department (HOSPITAL_COMMUNITY): Payer: Self-pay

## 2024-05-07 ENCOUNTER — Emergency Department (HOSPITAL_COMMUNITY)
Admission: EM | Admit: 2024-05-07 | Discharge: 2024-05-07 | Disposition: A | Discharge status: Home / Self Care | Payer: Self-pay | Attending: Emergency Medicine | Admitting: Emergency Medicine

## 2024-05-07 DIAGNOSIS — R197 Diarrhea, unspecified: Secondary | ICD-10-CM | POA: Insufficient documentation

## 2024-05-07 DIAGNOSIS — R110 Nausea: Secondary | ICD-10-CM

## 2024-05-07 DIAGNOSIS — R5383 Other fatigue: Secondary | ICD-10-CM | POA: Insufficient documentation

## 2024-05-07 DIAGNOSIS — R112 Nausea with vomiting, unspecified: Secondary | ICD-10-CM | POA: Insufficient documentation

## 2024-05-07 LAB — CBC WITH DIFFERENTIAL/PLATELET
Abs Immature Granulocytes: 0.03 K/uL (ref 0.00–0.07)
Basophils Absolute: 0 K/uL (ref 0.0–0.1)
Basophils Relative: 0 %
Eosinophils Absolute: 0 K/uL (ref 0.0–0.5)
Eosinophils Relative: 0 %
HCT: 36.2 % (ref 36.0–46.0)
Hemoglobin: 10.9 g/dL — ABNORMAL LOW (ref 12.0–15.0)
Immature Granulocytes: 0 %
Lymphocytes Relative: 24 %
Lymphs Abs: 1.9 K/uL (ref 0.7–4.0)
MCH: 21.3 pg — ABNORMAL LOW (ref 26.0–34.0)
MCHC: 30.1 g/dL (ref 30.0–36.0)
MCV: 70.8 fL — ABNORMAL LOW (ref 80.0–100.0)
Monocytes Absolute: 0.3 K/uL (ref 0.1–1.0)
Monocytes Relative: 3 %
Neutro Abs: 5.8 K/uL (ref 1.7–7.7)
Neutrophils Relative %: 73 %
Platelets: 243 K/uL (ref 150–400)
RBC: 5.11 MIL/uL (ref 3.87–5.11)
RDW: 15.7 % — ABNORMAL HIGH (ref 11.5–15.5)
WBC: 8 K/uL (ref 4.0–10.5)
nRBC: 0 % (ref 0.0–0.2)

## 2024-05-07 LAB — COMPREHENSIVE METABOLIC PANEL WITH GFR
ALT: 22 U/L (ref 0–44)
AST: 17 U/L (ref 15–41)
Albumin: 4.3 g/dL (ref 3.5–5.0)
Alkaline Phosphatase: 67 U/L (ref 38–126)
Anion gap: 10 (ref 5–15)
BUN: 9 mg/dL (ref 6–20)
CO2: 24 mmol/L (ref 22–32)
Calcium: 8.6 mg/dL — ABNORMAL LOW (ref 8.9–10.3)
Chloride: 107 mmol/L (ref 98–111)
Creatinine, Ser: 0.87 mg/dL (ref 0.44–1.00)
GFR, Estimated: >60 mL/min (ref >60)
Glucose, Bld: 104 mg/dL — ABNORMAL HIGH (ref 70–99)
Potassium: 3.9 mmol/L (ref 3.5–5.1)
Sodium: 142 mmol/L (ref 135–145)
Total Bilirubin: 1.2 mg/dL (ref 0.0–1.2)
Total Protein: 7.2 g/dL (ref 6.5–8.1)

## 2024-05-07 LAB — URINALYSIS, ROUTINE W REFLEX MICROSCOPIC
Bilirubin Urine: NEGATIVE
Glucose, UA: NEGATIVE mg/dL
Hgb urine dipstick: NEGATIVE
Ketones, ur: NEGATIVE mg/dL
Leukocytes,Ua: NEGATIVE
Nitrite: NEGATIVE
Protein, ur: NEGATIVE mg/dL
Specific Gravity, Urine: 1.016 (ref 1.005–1.030)
pH: 5 (ref 5.0–8.0)

## 2024-05-07 LAB — LIPASE, BLOOD: Lipase: 28 U/L (ref 11–51)

## 2024-05-07 LAB — TROPONIN T, HIGH SENSITIVITY: Troponin T High Sensitivity: <15 ng/L (ref 0–19)

## 2024-05-07 MED ORDER — ONDANSETRON 4 MG PO TBDP: 4.0000 mg | ORAL_TABLET | Freq: Three times a day (TID) | ORAL | 0 refills | Status: AC | PRN

## 2024-05-07 MED ORDER — PANTOPRAZOLE SODIUM 40 MG IV SOLR
40.0000 mg | Freq: Once | INTRAVENOUS | Status: AC
  Administered 2024-05-07: 40 mg via INTRAVENOUS
  Filled 2024-05-07: qty 10

## 2024-05-07 MED ORDER — ONDANSETRON HCL 4 MG/2ML IJ SOLN
4.0000 mg | Freq: Once | INTRAMUSCULAR | Status: AC
  Administered 2024-05-07: 4 mg via INTRAVENOUS
  Filled 2024-05-07: qty 2

## 2024-05-07 MED ORDER — IOHEXOL 300 MG/ML  SOLN
100.0000 mL | Freq: Once | INTRAMUSCULAR | Status: AC | PRN
  Administered 2024-05-07: 100 mL via INTRAVENOUS

## 2024-05-07 MED ORDER — PANTOPRAZOLE SODIUM 40 MG PO TBEC: 40.0000 mg | DELAYED_RELEASE_TABLET | Freq: Every day | ORAL | 0 refills | Status: DC

## 2024-05-07 NOTE — ED Provider Notes (Signed)
 [place] EMERGENCY DEPARTMENT AT [facility] Provider Note   CSN: [id number] Arrival date & time: 05/07/24  1002     Patient presents with: Nausea   Stephanie Kidd is a 53 y.o. female with PMHx IDA who presents to ED concerned for nausea. Patient stating that she took a dose of Meloxicam last week and started having nausea, vomiting, and diarrhea and fatigue that lasted many days. Patient eventually recovered and felt fine over the past 1 week until she started getting nauseated again this morning. Patient endorsing chronic daily/morning diarrhea after drinking her protein shake, but has not had any other episodes of diarrhea over the past couple of days. Denies vomiting today. Denies fever, cough, rhinorrhea, hematochezia, dysuria, hematuria.  Patient did have one episode of central chest pain last week that woke her up out of her sleep and lasted for maybe 3 hours.  Other than the dose last week, patient does not usually take NSAIDS.   HPI     Prior to Admission medications   Medication Sig Start Date End Date Taking? Authorizing Provider  ondansetron  (ZOFRAN -ODT) 4 MG disintegrating tablet Take 1 tablet (4 mg total) by mouth every 8 (eight) hours as needed for nausea. 05/07/24  Yes Hoy Fraction F, PA-C  pantoprazole  (PROTONIX ) 40 MG tablet Take 1 tablet (40 mg total) by mouth daily for 15 days. 05/07/24 05/22/24 Yes Brantley Wiley, Fraction F, PA-C  cetirizine (ZYRTEC) 10 MG tablet Take 10 mg by mouth daily.    [provider]  fluticasone (FLONASE) 50 MCG/ACT nasal spray Place 1 spray into both nostrils as needed for allergies or rhinitis.    [provider]  Multiple Vitamins-Minerals (ADULT ONE DAILY GUMMIES PO) Take 1 tablet by mouth daily.    [provider]  pseudoephedrine (SUDAFED) 120 MG 12 hr tablet Take 120 mg by mouth every 12 (twelve) hours as needed for congestion.    [provider]    Allergies: Patient has no known  allergies.    Review of Systems  Gastrointestinal:  Positive for nausea.    Updated Vital Signs BP 134/87 (BP Location: Left Arm)   Pulse 63   Temp 98.6 F (37 C) (Oral)   Resp 17   LMP 11/12/2017   SpO2 99%   Physical Exam Vitals and nursing note reviewed.  Constitutional:      General: She is not in acute distress.    Appearance: She is not ill-appearing or toxic-appearing.  HENT:     Head: Normocephalic and atraumatic.     Mouth/Throat:     Mouth: Mucous membranes are moist.  Eyes:     General: No scleral icterus.       Right eye: No discharge.        Left eye: No discharge.     Conjunctiva/sclera: Conjunctivae normal.  Cardiovascular:     Rate and Rhythm: Normal rate and regular rhythm.     Pulses: Normal pulses.     Heart sounds: Normal heart sounds. No murmur heard. Pulmonary:     Effort: Pulmonary effort is normal. No respiratory distress.     Breath sounds: Normal breath sounds. No wheezing, rhonchi or rales.  Abdominal:     General: Abdomen is flat. Bowel sounds are normal. There is no distension.     Palpations: Abdomen is soft. There is no mass.     Tenderness: There is abdominal tenderness.     Comments: Mild LLQ tenderness to palpation  Musculoskeletal:  Right lower leg: No edema.     Left lower leg: No edema.  Skin:    General: Skin is warm and dry.     Findings: No rash.  Neurological:     General: No focal deficit present.     Mental Status: She is alert and oriented to person, place, and time. Mental status is at baseline.  Psychiatric:        Mood and Affect: Mood normal.        Behavior: Behavior normal.     (all labs ordered are listed, but only abnormal results are displayed) Labs Reviewed  COMPREHENSIVE METABOLIC PANEL WITH GFR - Abnormal; Notable for the following components:      Result Value   Glucose, Bld 104 (*)    Calcium 8.6 (*)    All other components within normal limits  CBC WITH DIFFERENTIAL/PLATELET - Abnormal;  Notable for the following components:   Hemoglobin 10.9 (*)    MCV 70.8 (*)    MCH 21.3 (*)    RDW 15.7 (*)    All other components within normal limits  URINALYSIS, ROUTINE W REFLEX MICROSCOPIC - Abnormal; Notable for the following components:   APPearance HAZY (*)    All other components within normal limits  LIPASE, BLOOD  TROPONIN T, HIGH SENSITIVITY    EKG: None  Radiology: CT ABDOMEN PELVIS W CONTRAST Result Date: 05/07/2024 CLINICAL DATA:  Left lower quadrant abdominal pain, nausea for 2 weeks EXAM: CT ABDOMEN AND PELVIS WITH CONTRAST TECHNIQUE: Multidetector CT imaging of the abdomen and pelvis was performed using the standard protocol following bolus administration of intravenous contrast. RADIATION DOSE REDUCTION: This exam was performed according to the departmental dose-optimization program which includes automated exposure control, adjustment of the mA and/or kV according to patient size and/or use of iterative reconstruction technique. CONTRAST:  100mL OMNIPAQUE IOHEXOL 300 MG/ML  SOLN COMPARISON:  None Available. FINDINGS: Lower chest: No acute pleural or parenchymal lung disease. Hepatobiliary: No focal liver abnormality is seen. No gallstones, gallbladder wall thickening, or biliary dilatation. Pancreas: Unremarkable. No pancreatic ductal dilatation or surrounding inflammatory changes. Spleen: Normal in size without focal abnormality. Adrenals/Urinary Tract: Adrenal glands are unremarkable. Kidneys are normal, without renal calculi, focal lesion, or hydronephrosis. The bladder is decompressed, limiting its evaluation. Stomach/Bowel: No bowel obstruction or ileus. Normal appendix right lower quadrant. No bowel wall thickening or inflammatory change. Vascular/Lymphatic: No significant vascular findings are present. No enlarged abdominal or pelvic lymph nodes. Reproductive: Uterus and bilateral adnexa are unremarkable. Other: No free fluid or free intraperitoneal gas. No abdominal  wall hernia. 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 There are no acute displaced fractures. Reconstructed images demonstrate no additional findings. IMPRESSION: 1. No acute intra-abdominal or intrapelvic process. No explanation for the patient's clinical symptoms. Electronically Signed   By: Ozell Daring M.D.   On: 05/07/2024 15:02   DG Chest 2 View Result Date: 05/07/2024 CLINICAL DATA:  Chest pain and nausea for 2 weeks. EXAM: CHEST - 2 VIEW COMPARISON:  08/30/2014 FINDINGS: The heart size and mediastinal contours are within normal limits allowing for low lung volumes. Both lungs are clear. The visualized skeletal structures are unremarkable. IMPRESSION: No active cardiopulmonary disease. Electronically Signed   By: Norleen DELENA Kil M.D.   On: 05/07/2024 13:10     Procedures   Medications Ordered in the ED  ondansetron  (ZOFRAN ) injection 4 mg (4 mg Intravenous Given 05/07/24 1310)  pantoprazole  (PROTONIX ) injection 40  mg (40 mg Intravenous Given 05/07/24 1309)  iohexol (OMNIPAQUE) 300 MG/ML solution 100 mL (100 mLs Intravenous Contrast Given 05/07/24 1354)                                    Medical Decision Making Amount and/or Complexity of Data Reviewed Labs: ordered. Radiology: ordered.  Risk Prescription drug management.    This patient presents to the ED for concern of abdominal pain, this involves an extensive number of treatment options, and is a complaint that carries with it a high risk of complications and morbidity.  The differential diagnosis includes gastroenteritis, colitis, small bowel obstruction, appendicitis, cholecystitis, pancreatitis, nephrolithiasis, UTI, pyelonephritis   Co morbidities that complicate the patient evaluation  IDA   Additional history obtained:  Dr. Catharine PCP    Problem List / ED Course / Critical interventions / Medication management  Patient presented  for nausea intermittently over the past 2 weeks.  Physical exam with mild LLQ tenderness to palpation.  Rest of physical exam reassuring.  Patient afebrile with stable vitals. I Ordered, and personally interpreted labs.  UA not concerning for infection.  CBC without leukocytosis.  There is stable anemia with hemoglobin at 10.9 today.  CMP reassuring.  Lipase within normal limits.  Troponin within normal limits. I ordered imaging studies including CT Abd/Pelvis with contrast: evaluate for structural/surgical etiology of patients' severe abdominal pain. I independently visualized and interpreted imaging and I agree with the radiologist interpretation of no acute process. I personally viewed and interpreted the EKG/cardiac monitored which showed an underlying rhythm of: sinus rhythm - no STEMI. All results with patient.  Answered all questions.  Patient feeling better after Zofran .  Overall, I have higher suspicion for acid reflux vs gastric ulcer.  Will start patient on Zofran  and Protonix .  Patient stated that she does have an appointment with GI in 18 days.  Patient to follow-up with that appointment. I have reviewed the patients home medicines and have made adjustments as needed The patient has been appropriately medically screened and/or stabilized in the ED. I have low suspicion for any other emergent medical condition which would require further screening, evaluation or treatment in the ED or require inpatient management. At time of discharge the patient is hemodynamically stable and in no acute distress. I have discussed work-up results and diagnosis with patient and answered all questions. Patient is agreeable with discharge plan. We discussed strict return precautions for returning to the emergency department and they verbalized understanding.     Social Determinants of Health:  none       Final diagnoses:  Nausea    ED Discharge Orders          Ordered    pantoprazole  (PROTONIX ) 40 MG  tablet  Daily        05/07/24 1515    ondansetron  (ZOFRAN -ODT) 4 MG disintegrating tablet  Every 8 hours PRN        05/07/24 1516               Hoy Nidia FALCON, PA-C 05/07/24 1522    Charlyn Sora, MD 05/08/24 0715

## 2024-05-07 NOTE — Discharge Instructions (Addendum)
 Please follow-up with your primary care provider.  Seek emergency care if experiencing any new or worsening symptoms.

## 2024-05-07 NOTE — ED Triage Notes (Signed)
 Pt ambulatory to triage with complaints of nausea X2 weeks. PT states that her sx began after taking Meloxicam 2 weeks ago, and have persisted. PT states that she has not been taking the medication, but has been taking ondansetron  with some relief.   PCP was also concerned with pts liver enzymes being elevated.

## 2024-05-10 ENCOUNTER — Encounter: Payer: Self-pay | Admitting: Radiology

## 2024-05-25 ENCOUNTER — Ambulatory Visit (INDEPENDENT_AMBULATORY_CARE_PROVIDER_SITE_OTHER): Payer: Self-pay | Admitting: Gastroenterology

## 2024-05-25 ENCOUNTER — Encounter (INDEPENDENT_AMBULATORY_CARE_PROVIDER_SITE_OTHER): Payer: Self-pay | Admitting: Gastroenterology

## 2024-05-25 VITALS — BP 130/85 | HR 73 | Temp 97.9°F | Ht <= 58 in | Wt 160.4 lb

## 2024-05-25 DIAGNOSIS — D509 Iron deficiency anemia, unspecified: Secondary | ICD-10-CM

## 2024-05-25 DIAGNOSIS — A048 Other specified bacterial intestinal infections: Secondary | ICD-10-CM

## 2024-05-25 DIAGNOSIS — B9681 Helicobacter pylori [H. pylori] as the cause of diseases classified elsewhere: Secondary | ICD-10-CM

## 2024-05-25 DIAGNOSIS — K2970 Gastritis, unspecified, without bleeding: Secondary | ICD-10-CM

## 2024-05-25 MED ORDER — PANTOPRAZOLE SODIUM 40 MG PO TBEC: 40.0000 mg | DELAYED_RELEASE_TABLET | Freq: Every day | ORAL | 0 refills | Status: AC

## 2024-05-25 NOTE — Progress Notes (Signed)
 Starlene Consuegra Faizan Aaliyan Brinkmeier , M.D. Gastroenterology & Hepatology [facility] Gastroenterology [address] [place], [place] [address] Primary Care Physician: Catharine Ethelene BIRCH., PA-C [address] [place] [address]  Chief Complaint: H. pylori gastritis, microcytic anemia   History of Present Illness:  Stephanie Kidd is a 53 y.o. female with no significant medical problems who presents for evaluation of H. pylori gastritis, microcytic anemia   Patient was seen 01/2023 for iron deficiency where she underwent upper endoscopy and colonoscopy, found to have H. Pylori and treated with Bismuth based quadruple therapy  Patient reports last week she had sudden onset nausea and vomiting with diarrhea where she went to the ER.  She had a CT scan of her abdomen which was essentially negative and was sent home with symptomatic treatment.  Since then she has significantly improved with have occasional nausea only.  Patient reports after taking Protonix  she feels back to her baseline The patient denies having any nausea, vomiting, fever, chills, hematochezia, melena, hematemesis, abdominal distention, abdominal pain, diarrhea, jaundice, pruritus or weight loss.  Most recent labs from 04/2024 hemoglobin 12.9 MCV 70 platelet 243 normal liver enzymes  Last EGD:01/2023  - Esophagogastric landmarks identified. - Erythematous mucosa in the stomach. Biopsied. - A single papule ( nodule) found in the stomach. Biopsied. - Normal duodenal bulb and second portion of the duodenum.  Last Colonoscopy:01/2023  - One 3 mm polyp in the sigmoid colon, removed with a cold biopsy forceps. Resected and retrieved. - Non- bleeding external internal hemorrhoids.  A. GASTRIC, BIOPSY:  -  Antral mucosa with chronic active Helicobacter associated gastritis.  -  Numerous Helicobacter pylori organisms are identified on the HE  stained slide.   B. GASTRIC NODULE, BIOPSY:  -  Antral type mucosa  with moderate chronic active Helicobacter  associated gastritis.  -  Numerous Helicobacter pylori organisms identified on HE stained  slide.   C. COLON, SIGMOID, POLYPECTOMY:  -  Hyperplastic polyp.   FHx: neg for any gastrointestinal/liver disease, Mother breast cancer Social: neg smoking, alcohol or illicit drug use Surgical: -C-Section, TL  Past Medical History:History reviewed. No pertinent past medical history.  Past Surgical History: Past Surgical History:  Procedure Laterality Date   BIOPSY  01/30/2023   Procedure: BIOPSY;  Surgeon: Cinderella Deatrice FALCON, MD;  [facility];  Service: Endoscopy;;   CESAREAN SECTION  1989   COLONOSCOPY WITH PROPOFOL  N/A 01/30/2023   Procedure: COLONOSCOPY WITH PROPOFOL ;  Surgeon: Cinderella Deatrice FALCON, MD;  Location: [facility];  Service: Endoscopy;  Laterality: N/A;  1:30pm;asa 3   ESOPHAGOGASTRODUODENOSCOPY (EGD) WITH PROPOFOL  N/A 01/30/2023   Procedure: ESOPHAGOGASTRODUODENOSCOPY (EGD) WITH PROPOFOL ;  Surgeon: Cinderella Deatrice FALCON, MD;  Location: [facility];  Service: Endoscopy;  Laterality: N/A;  1:30pm;asa 3   POLYPECTOMY  01/30/2023   Procedure: POLYPECTOMY;  Surgeon: Cinderella Deatrice FALCON, MD;  Location: [facility];  Service: Endoscopy;;   TUBAL LIGATION      Family History: Family History  Problem Relation Age of Onset   Breast cancer Mother 89   Hypertension Maternal Grandmother     Social History: Social History   Tobacco Use  Smoking Status Never  Smokeless Tobacco Not on file   Social History   Substance and Sexual Activity  Alcohol Use No   Social History   Substance and Sexual Activity  Drug Use No    Allergies: No Known Allergies  Medications: Current Outpatient Medications  Medication Sig  Dispense Refill   cetirizine (ZYRTEC) 10 MG tablet Take 10 mg by mouth daily.     fluticasone (FLONASE) 50 MCG/ACT nasal spray Place 1 spray into both nostrils as needed for allergies or rhinitis.     Multiple  Vitamins-Minerals (ADULT ONE DAILY GUMMIES PO) Take 1 tablet by mouth daily.     ondansetron  (ZOFRAN -ODT) 4 MG disintegrating tablet Take 1 tablet (4 mg total) by mouth every 8 (eight) hours as needed for nausea. 10 tablet 0   pantoprazole  (PROTONIX ) 40 MG tablet Take 1 tablet (40 mg total) by mouth daily for 15 days. 15 tablet 0   pseudoephedrine (SUDAFED) 120 MG 12 hr tablet Take 120 mg by mouth every 12 (twelve) hours as needed for congestion.     No current facility-administered medications for this visit.    Review of Systems: GENERAL: negative for malaise, night sweats HEENT: No changes in hearing or vision, no nose bleeds or other nasal problems. NECK: Negative for lumps, goiter, pain and significant neck swelling RESPIRATORY: Negative for cough, wheezing CARDIOVASCULAR: Negative for chest pain, leg swelling, palpitations, orthopnea GI: SEE HPI MUSCULOSKELETAL: Negative for joint pain or swelling, back pain, and muscle pain. SKIN: Negative for lesions, rash HEMATOLOGY Negative for prolonged bleeding, bruising easily, and swollen nodes. ENDOCRINE: Negative for cold or heat intolerance, polyuria, polydipsia and goiter. NEURO: negative for tremor, gait imbalance, syncope and seizures. The remainder of the review of systems is noncontributory.   Physical Exam: BP 130/85   Pulse 73   Temp 97.9 F (36.6 C)   Ht 4' 9 (1.448 m)   Wt 160 lb 6.4 oz (72.8 kg)   LMP 11/12/2017   BMI 34.71 kg/m  GENERAL: The patient is AO x3, in no acute distress. HEENT: Head is normocephalic and atraumatic. EOMI are intact. Mouth is well hydrated and without lesions. NECK: Supple. No masses LUNGS: Clear to auscultation. No presence of rhonchi/wheezing/rales. Adequate chest expansion HEART: RRR, normal s1 and s2. ABDOMEN: Soft, nontender, no guarding, no peritoneal signs, and nondistended. BS +. No masses.  Imaging/Labs: as above     Latest Ref Rng & Units 05/07/2024   12:46 PM 01/21/2023    11:29 AM 08/30/2014    3:10 PM  CBC  WBC 4.0 - 10.5 K/uL 8.0  6.1  8.7   Hemoglobin 12.0 - 15.0 g/dL 89.0  88.6  9.6   Hematocrit 36.0 - 46.0 % 36.2  36.4  30.8   Platelets 150 - 400 K/uL 243  332  410    Lab Results  Component Value Date   IRON 77 01/21/2023   TIBC 324 01/21/2023   FERRITIN 60 01/21/2023    I personally reviewed and interpreted the available labs, imaging and endoscopic files.  Ct 04/2024   IMPRESSION: 1. No acute intra-abdominal or intrapelvic process. No explanation for the patient's clinical symptoms.    Impression and Plan: Jakai Onofre is a 53 y.o. female with no significant medical problems who presents for evaluation of H. pylori gastritis, microcytic anemia   #H. Pylori gastritis #Gastric Nodule   On upper endoscopy patient was found to have H. pylori gastritis underwent placement based quadruple therapy  Will check eradication with stool H. 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  I will reach out to our advanced endoscopist to see if this is something which would benefit from EUS  The patient continues to have  nausea and upper endoscopy/EUS with indicated time regardless  #Microcytic anemia  Iron profile from 01/2023 without any iron deficiency.  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 I spoke with patient over the phone Unless patient becomes iron deficient or has any worsening symptoms we will hold on to EUS at this time . May consider repeat EGD in 1- 2 years to assess stability . Patient agrees and verbalizes understanding over the phone   All questions were answered.       Festus Pursel Faizan Theodis Kinsel, MD Gastroenterology and Hepatology [facility] Gastroenterology   This chart has been completed using [facility] Dictation software, and while attempts have been made to ensure accuracy , certain words and phrases may not be transcribed as intended

## 2024-05-25 NOTE — Patient Instructions (Signed)
 It was very nice to meet you today, as dicussed with will plan for the following :  1) I recommend taking Protonix  40mg  , 30 min before breakfast    2) Stool sample after stopping protonix  for 2 weeks atleast

## 2024-06-08 ENCOUNTER — Inpatient Hospital Stay: Payer: Self-pay

## 2024-06-08 ENCOUNTER — Inpatient Hospital Stay: Payer: Self-pay | Attending: Obstetrics and Gynecology | Admitting: Oncology

## 2024-06-08 VITALS — BP 142/97 | HR 70 | Temp 97.7°F | Resp 18 | Ht <= 58 in | Wt 160.7 lb

## 2024-06-08 DIAGNOSIS — D509 Iron deficiency anemia, unspecified: Secondary | ICD-10-CM

## 2024-06-08 DIAGNOSIS — K909 Intestinal malabsorption, unspecified: Secondary | ICD-10-CM | POA: Insufficient documentation

## 2024-06-08 LAB — CBC WITH DIFFERENTIAL/PLATELET
Abs Immature Granulocytes: 0.02 K/uL (ref 0.00–0.07)
Basophils Absolute: 0 K/uL (ref 0.0–0.1)
Basophils Relative: 1 %
Eosinophils Absolute: 0.2 K/uL (ref 0.0–0.5)
Eosinophils Relative: 3 %
HCT: 37.3 % (ref 36.0–46.0)
Hemoglobin: 11.2 g/dL — ABNORMAL LOW (ref 12.0–15.0)
Immature Granulocytes: 0 %
Lymphocytes Relative: 36 %
Lymphs Abs: 2.4 K/uL (ref 0.7–4.0)
MCH: 21.9 pg — ABNORMAL LOW (ref 26.0–34.0)
MCHC: 30 g/dL (ref 30.0–36.0)
MCV: 73 fL — ABNORMAL LOW (ref 80.0–100.0)
Monocytes Absolute: 0.5 K/uL (ref 0.1–1.0)
Monocytes Relative: 8 %
Neutro Abs: 3.5 K/uL (ref 1.7–7.7)
Neutrophils Relative %: 52 %
Platelets: 363 K/uL (ref 150–400)
RBC: 5.11 MIL/uL (ref 3.87–5.11)
RDW: 15.7 % — ABNORMAL HIGH (ref 11.5–15.5)
WBC: 6.6 K/uL (ref 4.0–10.5)
nRBC: 0 % (ref 0.0–0.2)

## 2024-06-08 LAB — VITAMIN B12: Vitamin B-12: 1126 pg/mL — ABNORMAL HIGH (ref 180–914)

## 2024-06-08 LAB — COMPREHENSIVE METABOLIC PANEL WITH GFR
ALT: 31 U/L (ref 0–44)
AST: 36 U/L (ref 15–41)
Albumin: 4.4 g/dL (ref 3.5–5.0)
Alkaline Phosphatase: 52 U/L (ref 38–126)
Anion gap: 12 (ref 5–15)
BUN: 21 mg/dL — ABNORMAL HIGH (ref 6–20)
CO2: 24 mmol/L (ref 22–32)
Calcium: 9.5 mg/dL (ref 8.9–10.3)
Chloride: 106 mmol/L (ref 98–111)
Creatinine, Ser: 0.89 mg/dL (ref 0.44–1.00)
GFR, Estimated: >60 mL/min (ref >60)
Glucose, Bld: 99 mg/dL (ref 70–99)
Potassium: 4.1 mmol/L (ref 3.5–5.1)
Sodium: 142 mmol/L (ref 135–145)
Total Bilirubin: 0.8 mg/dL (ref 0.0–1.2)
Total Protein: 6.9 g/dL (ref 6.5–8.1)

## 2024-06-08 LAB — FOLATE: Folate: 17.3 ng/mL (ref >5.9)

## 2024-06-08 LAB — FERRITIN: Ferritin: 69 ng/mL (ref 11–307)

## 2024-06-08 LAB — IRON AND TIBC
Iron: 62 ug/dL (ref 28–170)
Saturation Ratios: 17 % (ref 10.4–31.8)
TIBC: 363 ug/dL (ref 250–450)
UIBC: 300 ug/dL

## 2024-06-08 NOTE — Progress Notes (Signed)
 [facility] at [facility]  HEMATOLOGY NEW VISIT  Muse, Ethelene D., PA-C  REASON FOR REFERRAL: Microcytic anemia  SUMMARY OF HEMATOLOGIC HISTORY: Stephanie Kidd is a 53 year old female with no significant medical problems who recently presented to GI for evaluation of gastritis and microcytic anemia.  Patient was seen on 7/24 for iron deficiency where she underwent upper endoscopy and colonoscopy and found to have H. pylori and treated with bismuth based quadruple therapy.  She presented to the ED on 05/07/2024 for sudden onset nausea and vomiting with diarrhea and workup included a CT scan of her abdomen which was unremarkable.  She was discharged home with symptomatic treatment.  She is currently taking Protonix  which appears to be helping.  Labs from October 2025 showed a hemoglobin of 10.9, MCV 70.8, RDW 15.7 with normal platelet and white blood cell count.  Differential is unremarkable.  She was seen by Dr. Dominique on 05/25/2024 for follow-up for H. pylori where she had a repeat upper endoscopy which showed a gastric nodule.  Dr. Dominique spoke with advanced endoscopist regarding gastric lesion typical of pancreatic rest and unless patient has worsening symptoms we will hold off on EUS at this time.  He recommended repeat EGD in 1 to 2 years.  Patient reports family history of microcytic anemia with personal history of anemia for many years.  She does not currently take iron supplements.  Last EGD:01/2023   - Esophagogastric landmarks identified. - Erythematous mucosa in the stomach. Biopsied. - A single papule ( nodule) found in the stomach. Biopsied. - Normal duodenal bulb and second portion of the duodenum.   Last Colonoscopy:01/2023   - One 3 mm polyp in the sigmoid colon, removed with a cold biopsy forceps. Resected and retrieved. - Non- bleeding external internal hemorrhoids.   HISTORY OF PRESENT ILLNESS: Stephanie Kidd 52 y.o. female referred for microcytic anemia.  The patient is alone today.   Patient referred a family and personal history of anemia.  Several years ago, she was taking iron supplements and tolerating them well.  More recently, she was only taking a multivitamin with iron in it.  Ports intentional loss of greater than 30 pounds from working out and she feels much better after this.  Reports feeling tired first thing in the morning and at bedtime but otherwise feels pretty good.  Denies ice pica.  Denies any obvious bleeding in her stools.  Last colonoscopy was in 2024.  She is no longer having a menstrual cycle.  No family history of sickle cell or thalassemia that she is aware of.   I have reviewed the past medical history, past surgical history, social history and family history with the patient   ALLERGIES:  has no known allergies.  MEDICATIONS:  Current Outpatient Medications  Medication Sig Dispense Refill   cetirizine (ZYRTEC) 10 MG tablet Take 10 mg by mouth daily.     diphenhydrAMINE (BENADRYL ALLERGY) 25 mg capsule Take 1-2 capsules by mouth daily as needed     fluticasone (FLONASE) 50 MCG/ACT nasal spray Place 1 spray into both nostrils as needed for allergies or rhinitis.     hydrOXYzine (ATARAX) 25 MG tablet Take 0.5 to 1 tab by mouth at night as needed for insomnia     Multiple Vitamins-Iron (MULTIVITAMINS WITH IRON) TABS tablet Take 1 tablet by mouth daily.     Multiple Vitamins-Minerals (ADULT ONE DAILY GUMMIES PO) Take 1 tablet by mouth daily.     ondansetron  (ZOFRAN -ODT) 4 MG  disintegrating tablet Take 1 tablet (4 mg total) by mouth every 8 (eight) hours as needed for nausea. 10 tablet 0   pantoprazole  (PROTONIX ) 40 MG tablet Take 1 tablet (40 mg total) by mouth daily. 30 tablet 0   promethazine  (PHENERGAN ) 25 MG tablet TAKE 1/2 TO 1 TABLET BY MOUTH EVERY 4-6 HOURS AS NEEDED FOR NAUSEA     pseudoephedrine (SUDAFED) 120 MG 12 hr tablet Take 120 mg by mouth every 12 (twelve) hours as needed for congestion.     No current  facility-administered medications for this visit.     REVIEW OF SYSTEMS:   Constitutional: Denies fevers, chills or night sweats Eyes: Denies blurriness of vision Ears, nose, mouth, throat, and face: Denies mucositis or sore throat Respiratory: Denies cough, dyspnea or wheezes Cardiovascular: Denies palpitation, chest discomfort or lower extremity swelling Gastrointestinal:  Denies nausea, heartburn or change in bowel habits Skin: Denies abnormal skin rashes Lymphatics: Denies new lymphadenopathy or easy bruising Neurological:Denies numbness, tingling or new weaknesses Behavioral/Psych: Mood is stable, no new changes  All other systems were reviewed with the patient and are negative.  PHYSICAL EXAMINATION:   Vitals:   06/08/24 0839  BP: (!) 142/97  Pulse: 70  Resp: 18  Temp: 97.7 F (36.5 C)  SpO2: 99%    GENERAL:alert, no distress and comfortable SKIN: skin color, texture, turgor are normal, no rashes or significant lesions EYES: normal, Conjunctiva are pink and non-injected, sclera clear OROPHARYNX:no exudate, no erythema and lips, buccal mucosa, and tongue normal  NECK: supple, thyroid normal size, non-tender, without nodularity LYMPH:  no palpable lymphadenopathy in the cervical, axillary or inguinal LUNGS: clear to auscultation and percussion with normal breathing effort HEART: regular rate & rhythm and no murmurs and no lower extremity edema ABDOMEN:abdomen soft, non-tender and normal bowel sounds Musculoskeletal:no cyanosis of digits and no clubbing  NEURO: alert & oriented x 3 with fluent speech, no focal motor/sensory deficits  LABORATORY DATA:  I have reviewed the data as listed  Lab Results  Component Value Date   WBC 8.0 05/07/2024   NEUTROABS 5.8 05/07/2024   HGB 10.9 (L) 05/07/2024   HCT 36.2 05/07/2024   MCV 70.8 (L) 05/07/2024   PLT 243 05/07/2024    No results found for: SPEP, UPEP     Chemistry      Component Value Date/Time   NA 142  05/07/2024 1246   K 3.9 05/07/2024 1246   CL 107 05/07/2024 1246   CO2 24 05/07/2024 1246   BUN 9 05/07/2024 1246   CREATININE 0.87 05/07/2024 1246      Component Value Date/Time   CALCIUM 8.6 (L) 05/07/2024 1246   ALKPHOS 67 05/07/2024 1246   AST 17 05/07/2024 1246   ALT 22 05/07/2024 1246   BILITOT 1.2 05/07/2024 1246       RADIOGRAPHIC STUDIES: I have personally reviewed the radiological images as listed and agreed with the findings in the report. CT ABDOMEN PELVIS W CONTRAST CLINICAL DATA:  Left lower quadrant abdominal pain, nausea for 2 weeks  EXAM: CT ABDOMEN AND PELVIS WITH CONTRAST  TECHNIQUE: Multidetector CT imaging of the abdomen and pelvis was performed using the standard protocol following bolus administration of intravenous contrast.  RADIATION DOSE REDUCTION: This exam was performed according to the departmental dose-optimization program which includes automated exposure control, adjustment of the mA and/or kV according to patient size and/or use of iterative reconstruction technique.  CONTRAST:  100mL OMNIPAQUE  IOHEXOL  300 MG/ML  SOLN  COMPARISON:  None  Available.  FINDINGS: Lower chest: No acute pleural or parenchymal lung disease.  Hepatobiliary: No focal liver abnormality is seen. No gallstones, gallbladder wall thickening, or biliary dilatation.  Pancreas: Unremarkable. No pancreatic ductal dilatation or surrounding inflammatory changes.  Spleen: Normal in size without focal abnormality.  Adrenals/Urinary Tract: Adrenal glands are unremarkable. Kidneys are normal, without renal calculi, focal lesion, or hydronephrosis. The bladder is decompressed, limiting its evaluation.  Stomach/Bowel: No bowel obstruction or ileus. Normal appendix right lower quadrant. No bowel wall thickening or inflammatory change.  Vascular/Lymphatic: No significant vascular findings are present. No enlarged abdominal or pelvic lymph nodes.  Reproductive: Uterus  and bilateral adnexa are unremarkable.  Other: No free fluid or free intraperitoneal gas. No abdominal wall hernia.  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 There are no acute displaced fractures. Reconstructed images demonstrate no additional findings.  IMPRESSION: 1. No acute intra-abdominal or intrapelvic process. No explanation for the patient's clinical symptoms.  Electronically Signed   By: Ozell Daring M.D.   On: 05/07/2024 15:02 DG Chest 2 View CLINICAL DATA:  Chest pain and nausea for 2 weeks.  EXAM: CHEST - 2 VIEW  COMPARISON:  08/30/2014  FINDINGS: The heart size and mediastinal contours are within normal limits allowing for low lung volumes. Both lungs are clear. The visualized skeletal structures are unremarkable.  IMPRESSION: No active cardiopulmonary disease.  Electronically Signed   By: Norleen DELENA Kil M.D.   On: 05/07/2024 13:10   ASSESSMENT & PLAN:  Patient is a 53 y.o. female referred for microcytic anemia.  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 Lab Results  Component Value Date   IRON 77 01/21/2023   TIBC 324 01/21/2023   FERRITIN 60 01/21/2023    Last colonoscopy/endoscopy: 04/2023  -Discussed labs today.  Will call with results. - Reviewed most recent labs from July 2024.  Most recent hemoglobin is from 05/07/2024 which showed mild anemia.  -We discussed some of the risks, benefits, and alternatives of intravenous iron infusions. Some of the side-effects to be expected including risks of infusion reactions, phlebitis, headaches, nausea and fatigue.  The patient is willing to proceed. Patient education material was dispensed. Goal is to keep ferritin level greater than 50 and resolution of anemia -Start oral iron every other day.  Use MiraLAX for constipation  Return to clinic  in 3 to 4 weeks for virtual visit.   Orders Placed This Encounter  Procedures   Ferritin    Standing Status:   Future    Number of Occurrences:   1    Expected Date:   06/08/2024    Expiration Date:   09/06/2024   CBC with Differential/Platelet    Standing Status:   Future    Number of Occurrences:   1    Expected Date:   06/08/2024    Expiration Date:   09/06/2024   Copper, serum    Standing Status:   Future    Number of Occurrences:   1    Expected Date:   06/08/2024    Expiration Date:   09/06/2024   Vitamin B12    Standing Status:   Future    Number of Occurrences:   1    Expected Date:   06/08/2024    Expiration Date:   09/06/2024   Methylmalonic acid, serum    Standing Status:   Future  Number of Occurrences:   1    Expected Date:   06/08/2024    Expiration Date:   09/06/2024   Iron and TIBC    Standing Status:   Future    Number of Occurrences:   1    Expected Date:   06/08/2024    Expiration Date:   09/06/2024   Folate    Standing Status:   Future    Number of Occurrences:   1    Expected Date:   06/08/2024    Expiration Date:   09/06/2024   CBC with Differential    Standing Status:   Future    Number of Occurrences:   1    Expected Date:   06/08/2024    Expiration Date:   09/06/2024   Comprehensive metabolic panel    Standing Status:   Future    Number of Occurrences:   1    Expected Date:   06/08/2024    Expiration Date:   09/06/2024    The total time spent in the appointment was 40 minutes encounter with patients including review of chart and various tests results, discussions about plan of care and coordination of care plan   All questions were answered. The patient knows to call the clinic with any problems, questions or concerns. No barriers to learning was detected.   Delon FORBES Hope, NP 12/2/20259:11 AM

## 2024-06-08 NOTE — Assessment & Plan Note (Signed)
 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 Lab Results  Component Value Date   IRON 77 01/21/2023   TIBC 324 01/21/2023   FERRITIN 60 01/21/2023    Last colonoscopy/endoscopy: 04/2023  -Discussed labs today.  Will call with results. - Reviewed most recent labs from July 2024.  Most recent hemoglobin is from 05/07/2024 which showed mild anemia.  -We discussed some of the risks, benefits, and alternatives of intravenous iron infusions. Some of the side-effects to be expected including risks of infusion reactions, phlebitis, headaches, nausea and fatigue.  The patient is willing to proceed. Patient education material was dispensed. Goal is to keep ferritin level greater than 50 and resolution of anemia -Start oral iron every other day.  Use MiraLAX for constipation  Return to clinic in 3 to 4 weeks for virtual visit.

## 2024-06-09 LAB — COPPER, SERUM: Copper: 105 ug/dL (ref 80–158)

## 2024-06-09 NOTE — Addendum Note (Signed)
 Addended by: Luay Balding on: 06/09/2024 09:40 PM   Modules accepted: Level of Service

## 2024-06-13 LAB — METHYLMALONIC ACID, SERUM: Methylmalonic Acid, Quantitative: 189 nmol/L (ref 0–378)

## 2024-06-21 ENCOUNTER — Inpatient Hospital Stay: Payer: Self-pay

## 2024-06-21 ENCOUNTER — Inpatient Hospital Stay: Payer: Self-pay | Admitting: Hematology and Oncology

## 2024-07-05 ENCOUNTER — Encounter: Payer: Self-pay | Admitting: *Deleted

## 2024-07-09 ENCOUNTER — Inpatient Hospital Stay: Payer: Self-pay | Attending: Obstetrics and Gynecology | Admitting: Oncology

## 2024-07-09 DIAGNOSIS — D509 Iron deficiency anemia, unspecified: Secondary | ICD-10-CM | POA: Insufficient documentation

## 2024-07-09 NOTE — Assessment & Plan Note (Addendum)
 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 Lab Results  Component Value Date   IRON 62 06/08/2024   TIBC 363 06/08/2024   FERRITIN 69 06/08/2024    Last colonoscopy/endoscopy: 04/2023  -Discussed labs today.  - She is currently taking iron supplements and a multivitamin with iron and. - Although she is tolerating iron supplements well, labs are still not improving the way we had hoped.  Iron saturation 17%, ferritin 69 within normal TIBC.  B12 level 1126 and she is still anemic.  Hemoglobin 11.2. - Discussed going ahead with IV iron.  Recommend IV Feraheme 510 mg x 2 doses given about a week apart.  -We discussed some of the risks, benefits, and alternatives of intravenous iron infusions. Some of the side-effects to be expected including risks of infusion reactions, phlebitis, headaches, nausea and fatigue.  The patient is willing to proceed. Patient education material was dispensed. Goal is to keep ferritin level greater than 50 and resolution of anemia - Continue oral iron every other day.  Use MiraLAX for constipation  Return to clinic in 3 months for labs and in person visit.SABRA

## 2024-07-09 NOTE — Progress Notes (Signed)
 " [facility] at [facility]  HEMATOLOGY NEW VISIT  Muse, Pickens D., PA-C  REASON FOR REFERRAL: Microcytic anemia  I connected with Stephanie Kidd on 07/12/2024 at 11:30 AM EST by telephone visit and verified that I am speaking with the correct person using two identifiers.   I discussed the limitations, risks, security and privacy concerns of performing an evaluation and management service by telemedicine and the availability of in-person appointments. I also discussed with the patient that there may be a patient responsible charge related to this service. The patient expressed understanding and agreed to proceed.   Other persons participating in the visit and their role in the encounter: NP. Patient    Patients location: Home Providers location: Clinic   SUMMARY OF HEMATOLOGIC HISTORY: Stephanie Kidd is a 54 year old female with no significant medical problems who recently presented to GI for evaluation of gastritis and microcytic anemia.  Patient was seen on 7/24 for iron deficiency where she underwent upper endoscopy and colonoscopy and found to have H. pylori and treated with bismuth based quadruple therapy.  She presented to the ED on 05/07/2024 for sudden onset nausea and vomiting with diarrhea and workup included a CT scan of her abdomen which was unremarkable.  She was discharged home with symptomatic treatment.  She is currently taking Protonix  which appears to be helping.  Labs from October 2025 showed a hemoglobin of 10.9, MCV 70.8, RDW 15.7 with normal platelet and white blood cell count.  Differential is unremarkable.  She was seen by Dr. Dominique on 05/25/2024 for follow-up for H. pylori where she had a repeat upper endoscopy which showed a gastric nodule.  Dr. Dominique spoke with advanced endoscopist regarding gastric lesion typical of pancreatic rest and unless patient has worsening symptoms we will hold off on EUS at this time.  He recommended repeat EGD in 1 to 2  years.  Patient reports family history of microcytic anemia with personal history of anemia for many years.  She does not currently take iron supplements.  Last EGD:01/2023   - Esophagogastric landmarks identified. - Erythematous mucosa in the stomach. Biopsied. - A single papule ( nodule) found in the stomach. Biopsied. - Normal duodenal bulb and second portion of the duodenum.   Last Colonoscopy:01/2023   - One 3 mm polyp in the sigmoid colon, removed with a cold biopsy forceps. Resected and retrieved. - Non- bleeding external internal hemorrhoids.   HISTORY OF PRESENT ILLNESS: Stephanie Kidd 54 y.o. female referred for microcytic anemia.   Patient referred a family and personal history of anemia.  Several years ago, she was taking iron supplements and tolerating them well.  More recently, she was only taking a multivitamin with iron in it.  Reports intentional loss of greater than 30 pounds from working out and she feels much better after this.  Reports feeling tired first thing in the morning and at bedtime but otherwise feels pretty good.  Denies ice pica.  Denies any obvious bleeding in her stools.  Last colonoscopy was in 2024.  She is no longer having a menstrual cycle.  No family history of sickle cell or thalassemia that she is aware of.  She has never received IV iron.  Appetite and energy levels are 100%.  Reports depression at times.  Reports less shortness of breath since she has lost weight.  She has been taking iron supplements every other day with vitamin C without significant problem.   I have reviewed the past medical  history, past surgical history, social history and family history with the patient   ALLERGIES:  has no known allergies.  MEDICATIONS:  Current Outpatient Medications  Medication Sig Dispense Refill   cetirizine (ZYRTEC) 10 MG tablet Take 10 mg by mouth daily.     diphenhydrAMINE (BENADRYL ALLERGY) 25 mg capsule Take 1-2 capsules by mouth daily as needed      fluticasone (FLONASE) 50 MCG/ACT nasal spray Place 1 spray into both nostrils as needed for allergies or rhinitis.     hydrOXYzine (ATARAX) 25 MG tablet Take 0.5 to 1 tab by mouth at night as needed for insomnia     Multiple Vitamins-Iron (MULTIVITAMINS WITH IRON) TABS tablet Take 1 tablet by mouth daily.     Multiple Vitamins-Minerals (ADULT ONE DAILY GUMMIES PO) Take 1 tablet by mouth daily.     ondansetron  (ZOFRAN -ODT) 4 MG disintegrating tablet Take 1 tablet (4 mg total) by mouth every 8 (eight) hours as needed for nausea. 10 tablet 0   pantoprazole  (PROTONIX ) 40 MG tablet Take 1 tablet (40 mg total) by mouth daily. 30 tablet 0   promethazine  (PHENERGAN ) 25 MG tablet TAKE 1/2 TO 1 TABLET BY MOUTH EVERY 4-6 HOURS AS NEEDED FOR NAUSEA     pseudoephedrine (SUDAFED) 120 MG 12 hr tablet Take 120 mg by mouth every 12 (twelve) hours as needed for congestion.     No current facility-administered medications for this visit.     REVIEW OF SYSTEMS:   Review of Systems  Constitutional:  Positive for malaise/fatigue and weight loss.  Psychiatric/Behavioral:  Positive for depression. The patient has insomnia.      PHYSICAL EXAMINATION:   There were no vitals filed for this visit.   Physical Exam Neurological:     Mental Status: She is alert and oriented to person, place, and time.    LABORATORY DATA:  I have reviewed the data as listed  Lab Results  Component Value Date   WBC 6.6 06/08/2024   NEUTROABS 3.5 06/08/2024   HGB 11.2 (L) 06/08/2024   HCT 37.3 06/08/2024   MCV 73.0 (L) 06/08/2024   PLT 363 06/08/2024    No results found for: SPEP, UPEP     Chemistry      Component Value Date/Time   NA 142 06/08/2024 0902   K 4.1 06/08/2024 0902   CL 106 06/08/2024 0902   CO2 24 06/08/2024 0902   BUN 21 (H) 06/08/2024 0902   CREATININE 0.89 06/08/2024 0902      Component Value Date/Time   CALCIUM 9.5 06/08/2024 0902   ALKPHOS 52 06/08/2024 0902   AST 36 06/08/2024 0902    ALT 31 06/08/2024 0902   BILITOT 0.8 06/08/2024 0902       RADIOGRAPHIC STUDIES: I have personally reviewed the radiological images as listed and agreed with the findings in the report. CT ABDOMEN PELVIS W CONTRAST CLINICAL DATA:  Left lower quadrant abdominal pain, nausea for 2 weeks  EXAM: CT ABDOMEN AND PELVIS WITH CONTRAST  TECHNIQUE: Multidetector CT imaging of the abdomen and pelvis was performed using the standard protocol following bolus administration of intravenous contrast.  RADIATION DOSE REDUCTION: This exam was performed according to the departmental dose-optimization program which includes automated exposure control, adjustment of the mA and/or kV according to patient size and/or use of iterative reconstruction technique.  CONTRAST:  OMNIPAQUE  IOHEXOL  300 MG/ML  SOLN  COMPARISON:  None Available.  FINDINGS: Lower chest: No acute pleural or parenchymal lung disease.  Hepatobiliary: No  focal liver abnormality is seen. No gallstones, gallbladder wall thickening, or biliary dilatation.  Pancreas: Unremarkable. No pancreatic ductal dilatation or surrounding inflammatory changes.  Spleen: Normal in size without focal abnormality.  Adrenals/Urinary Tract: Adrenal glands are unremarkable. Kidneys are normal, without renal calculi, focal lesion, or hydronephrosis. The bladder is decompressed, limiting its evaluation.  Stomach/Bowel: No bowel obstruction or ileus. Normal appendix right lower quadrant. No bowel wall thickening or inflammatory change.  Vascular/Lymphatic: No significant vascular findings are present. No enlarged abdominal or pelvic lymph nodes.  Reproductive: Uterus and bilateral adnexa are unremarkable.  Other: No free fluid or free intraperitoneal gas. No abdominal wall hernia.  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 There are no acute displaced  fractures. Reconstructed images demonstrate no additional findings.  IMPRESSION: 1. No acute intra-abdominal or intrapelvic process. No explanation for the patient's clinical symptoms.  Electronically Signed   By: Ozell Daring M.D.   On: 05/07/2024 15:02 DG Chest 2 View CLINICAL DATA:  Chest pain and nausea for 2 weeks.  EXAM: CHEST - 2 VIEW  COMPARISON:  08/30/2014  FINDINGS: The heart size and mediastinal contours are within normal limits allowing for low lung volumes. Both lungs are clear. The visualized skeletal structures are unremarkable.  IMPRESSION: No active cardiopulmonary disease.  Electronically Signed   By: Norleen DELENA Kil M.D.   On: 05/07/2024 13:10   ASSESSMENT & PLAN:  Patient is a 54 y.o. female referred for microcytic anemia.  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 Lab Results  Component Value Date   IRON 62 06/08/2024   TIBC 363 06/08/2024   FERRITIN 69 06/08/2024    Last colonoscopy/endoscopy: 04/2023  -Discussed labs today.  - She is currently taking iron supplements and a multivitamin with iron and. - Although she is tolerating iron supplements well, labs are still not improving the way we had hoped.  Iron saturation 17%, ferritin 69 within normal TIBC.  B12 level 1126 and she is still anemic.  Hemoglobin 11.2. - Discussed going ahead with IV iron.  Recommend IV Feraheme 510 mg x 2 doses given about a week apart.  -We discussed some of the risks, benefits, and alternatives of intravenous iron infusions. Some of the side-effects to be expected including risks of infusion reactions, phlebitis, headaches, nausea and fatigue.  The patient is willing to proceed. Patient education material was dispensed. Goal is to keep ferritin level greater than 50 and resolution of anemia - Continue oral iron every other day.  Use MiraLAX for constipation  Return to clinic in 3 months  for labs and in person visit..   No orders of the defined types were placed in this encounter.   I provided 25 minutes of non face-to-face telephone visit time during this encounter, and > 50% was spent counseling as documented under my assessment & plan.    All questions were answered. The patient knows to call the clinic with any problems, questions or concerns. No barriers to learning was detected.   Delon FORBES Hope, NP 1/5/20269:42 AM  "

## 2024-07-10 ENCOUNTER — Encounter: Payer: Self-pay | Admitting: Oncology

## 2024-07-12 ENCOUNTER — Encounter: Payer: Self-pay | Admitting: Oncology

## 2024-07-13 NOTE — Progress Notes (Signed)
 Patient is applying for patient assistance.  Change iron infusion orders to:  Monoferric 1000 mg IVPB x 1 with associated premedication.  V.O. Delon Hope, NP/Bryan Omura Joshua, PharmD

## 2024-07-16 ENCOUNTER — Encounter: Payer: Self-pay | Admitting: Oncology

## 2024-07-16 ENCOUNTER — Inpatient Hospital Stay: Payer: Self-pay

## 2024-07-16 VITALS — BP 119/78 | HR 59 | Temp 97.8°F | Resp 17

## 2024-07-16 DIAGNOSIS — D500 Iron deficiency anemia secondary to blood loss (chronic): Secondary | ICD-10-CM

## 2024-07-16 MED ORDER — FAMOTIDINE IN NACL 20-0.9 MG/50ML-% IV SOLN
20.0000 mg | Freq: Once | INTRAVENOUS | Status: AC
  Administered 2024-07-16: 20 mg via INTRAVENOUS
  Filled 2024-07-16: qty 50

## 2024-07-16 MED ORDER — ACETAMINOPHEN 325 MG PO TABS
650.0000 mg | ORAL_TABLET | Freq: Once | ORAL | Status: AC
  Administered 2024-07-16: 650 mg via ORAL
  Filled 2024-07-16: qty 2

## 2024-07-16 MED ORDER — CETIRIZINE HCL 10 MG PO TABS
10.0000 mg | ORAL_TABLET | Freq: Once | ORAL | Status: AC
  Administered 2024-07-16: 10 mg via ORAL
  Filled 2024-07-16: qty 1

## 2024-07-16 MED ORDER — SODIUM CHLORIDE 0.9 % IV SOLN: INTRAVENOUS | Status: DC

## 2024-07-16 MED ORDER — METHYLPREDNISOLONE SODIUM SUCC 125 MG IJ SOLR
125.0000 mg | Freq: Once | INTRAMUSCULAR | Status: AC
  Administered 2024-07-16: 125 mg via INTRAVENOUS
  Filled 2024-07-16: qty 2

## 2024-07-16 MED ORDER — SODIUM CHLORIDE 0.9 % IV SOLN
1000.0000 mg | Freq: Once | INTRAVENOUS | Status: AC
  Administered 2024-07-16: 1000 mg via INTRAVENOUS
  Filled 2024-07-16: qty 1000

## 2024-07-16 NOTE — Patient Instructions (Signed)
 [facility] - A DEPT OF [facility]  Discharge Instructions: Thank you for choosing [facility] to provide your oncology and hematology care.  If you have a lab appointment with the Cancer Center - please note that after April 8th, 2024, all labs will be drawn in the cancer center.  You do not have to check in or register with the main entrance as you have in the past but will complete your check-in in the cancer center.  Wear comfortable clothing and clothing appropriate for easy access to any Portacath or PICC line.   We strive to give you quality time with your provider. You may need to reschedule your appointment if you arrive late (15 or more minutes).  Arriving late affects you and other patients whose appointments are after yours.  Also, if you miss three or more appointments without notifying the office, you may be dismissed from the clinic at the provider's discretion.      For prescription refill requests, have your pharmacy contact our office and allow 72 hours for refills to be completed.    Today you received the following Monoferric , return as scheduled.   To help prevent nausea and vomiting after your treatment, we encourage you to take your nausea medication as directed.  BELOW ARE SYMPTOMS THAT SHOULD BE REPORTED IMMEDIATELY: *FEVER GREATER THAN 100.4 F (38 C) OR HIGHER *CHILLS OR SWEATING *NAUSEA AND VOMITING THAT IS NOT CONTROLLED WITH YOUR NAUSEA MEDICATION *UNUSUAL SHORTNESS OF BREATH *UNUSUAL BRUISING OR BLEEDING *URINARY PROBLEMS (pain or burning when urinating, or frequent urination) *BOWEL PROBLEMS (unusual diarrhea, constipation, pain near the anus) TENDERNESS IN MOUTH AND THROAT WITH OR WITHOUT PRESENCE OF ULCERS (sore throat, sores in mouth, or a toothache) UNUSUAL RASH, SWELLING OR PAIN  UNUSUAL VAGINAL DISCHARGE OR ITCHING   Items with * indicate a potential emergency and should be followed up as soon as possible  or go to the Emergency Department if any problems should occur.  Please show the CHEMOTHERAPY ALERT CARD or IMMUNOTHERAPY ALERT CARD at check-in to the Emergency Department and triage nurse.  Should you have questions after your visit or need to cancel or reschedule your appointment, please contact [facility] [place] - A DEPT OF [facility] [phone]  and follow the prompts.  Office hours are 8:00 a.m. to 4:30 p.m. Monday - Friday. Please note that voicemails left after 4:00 p.m. may not be returned until the following business day.  We are closed weekends and major holidays. You have access to a nurse at all times for urgent questions. Please call the main number to the clinic [phone] and follow the prompts.  For any non-urgent questions, you may also contact your provider using MyChart. We now offer e-Visits for anyone 68 and older to request care online for non-urgent symptoms. For details visit [email or web address].   Also download the MyChart app! Go to the app store, search MyChart, open the app, select [facility], and log in with your MyChart username and password.

## 2024-07-16 NOTE — Progress Notes (Signed)
 Patient tolerated iron infusion with no complaints voiced.  Peripheral IV site clean and dry with good blood return noted before and after infusion.  Band aid applied.  VSS with discharge and left in satisfactory condition with no s/s of distress noted.

## 2024-07-19 ENCOUNTER — Telehealth (INDEPENDENT_AMBULATORY_CARE_PROVIDER_SITE_OTHER): Payer: Self-pay | Admitting: Gastroenterology

## 2024-07-19 NOTE — Telephone Encounter (Signed)
 Pt left voicemail stating that she wanted to let us  know she had her samples done today.

## 2024-07-21 LAB — H. PYLORI ANTIGEN, STOOL: H pylori Ag, Stl: POSITIVE — AB

## 2024-07-23 ENCOUNTER — Inpatient Hospital Stay: Payer: Self-pay

## 2024-07-26 ENCOUNTER — Ambulatory Visit (INDEPENDENT_AMBULATORY_CARE_PROVIDER_SITE_OTHER): Payer: Self-pay | Admitting: Gastroenterology

## 2024-07-26 DIAGNOSIS — A048 Other specified bacterial intestinal infections: Secondary | ICD-10-CM

## 2024-07-26 MED ORDER — BISMUTH/METRONIDAZ/TETRACYCLIN 140-125-125 MG PO CAPS: 3.0000 | ORAL_CAPSULE | Freq: Four times a day (QID) | ORAL | 0 refills | Status: AC

## 2024-07-26 MED ORDER — OMEPRAZOLE 20 MG PO CPDR: 20.0000 mg | DELAYED_RELEASE_CAPSULE | Freq: Two times a day (BID) | ORAL | 0 refills | Status: AC

## 2024-07-29 ENCOUNTER — Telehealth (INDEPENDENT_AMBULATORY_CARE_PROVIDER_SITE_OTHER): Payer: Self-pay | Admitting: Gastroenterology

## 2024-07-29 ENCOUNTER — Encounter (INDEPENDENT_AMBULATORY_CARE_PROVIDER_SITE_OTHER): Payer: Self-pay

## 2024-07-29 DIAGNOSIS — A048 Other specified bacterial intestinal infections: Secondary | ICD-10-CM

## 2024-07-29 MED ORDER — DOXYCYCLINE MONOHYDRATE 100 MG PO TABS: 100.0000 mg | ORAL_TABLET | Freq: Two times a day (BID) | ORAL | 0 refills | Status: AC

## 2024-07-29 MED ORDER — METRONIDAZOLE 500 MG PO TABS: 500.0000 mg | ORAL_TABLET | Freq: Three times a day (TID) | ORAL | 0 refills | Status: AC

## 2024-07-29 MED ORDER — BISMUTH 262 MG PO CHEW: 2.0000 | CHEWABLE_TABLET | Freq: Four times a day (QID) | ORAL | 0 refills | Status: AC

## 2024-07-29 NOTE — Telephone Encounter (Signed)
 I sent separately , take it with omeprazole  twice daily

## 2024-07-29 NOTE — Telephone Encounter (Signed)
 Pt replied via my chart. Pt made aware that separate meds were sent in

## 2024-07-29 NOTE — Telephone Encounter (Signed)
 Pt left voicemail in regards to antibiotic that was suppose to be sent in. Pylera sent in on 07/26/24. Pharmacy confirmed receipt at 6:33pm Contacted [facility] and pharmacist stated they did not have that on file but did have Omeprazole  ready. Pharmacy stated that the medication is going to be a couple hundred dollars. We will need to send in separate components of Pylera. Please advise. Thank you.  Attempted to reach pt to let her know but voicemail was not set up. Sent my chart message to patient.

## 2024-07-29 NOTE — Addendum Note (Signed)
 Addended by: CINDERELLA DEATRICE SMILES on: 07/29/2024 09:40 AM   Modules accepted: Orders

## 2024-09-30 ENCOUNTER — Inpatient Hospital Stay: Payer: Self-pay

## 2024-10-07 ENCOUNTER — Inpatient Hospital Stay: Payer: Self-pay | Admitting: Oncology
